# Patient Record
Sex: Male | Born: 1955 | Race: White | Hispanic: No | Marital: Single | Smoking: Current every day smoker
Health system: Southern US, Community
[De-identification: ages and names within clinical notes are randomized; demographics above are authoritative.]

## PROBLEM LIST (undated history)

## (undated) HISTORY — PX: APPENDECTOMY: SHX54

---

## 2006-08-30 ENCOUNTER — Emergency Department (HOSPITAL_COMMUNITY): Admission: EM | Admit: 2006-08-30 | Discharge: 2006-08-30 | Payer: Self-pay | Admitting: Emergency Medicine

## 2013-11-05 ENCOUNTER — Emergency Department (HOSPITAL_COMMUNITY)
Admission: EM | Admit: 2013-11-05 | Discharge: 2013-11-05 | Disposition: A | Discharge status: Home / Self Care | Payer: Self-pay | Attending: Emergency Medicine | Admitting: Emergency Medicine

## 2013-11-05 ENCOUNTER — Emergency Department (HOSPITAL_COMMUNITY): Payer: Self-pay

## 2013-11-05 ENCOUNTER — Encounter (HOSPITAL_COMMUNITY): Payer: Self-pay | Admitting: Emergency Medicine

## 2013-11-05 DIAGNOSIS — W268XXA Contact with other sharp object(s), not elsewhere classified, initial encounter: Secondary | ICD-10-CM | POA: Insufficient documentation

## 2013-11-05 DIAGNOSIS — Y990 Civilian activity done for income or pay: Secondary | ICD-10-CM | POA: Insufficient documentation

## 2013-11-05 DIAGNOSIS — Z23 Encounter for immunization: Secondary | ICD-10-CM | POA: Insufficient documentation

## 2013-11-05 DIAGNOSIS — Y9289 Other specified places as the place of occurrence of the external cause: Secondary | ICD-10-CM | POA: Insufficient documentation

## 2013-11-05 DIAGNOSIS — S51809A Unspecified open wound of unspecified forearm, initial encounter: Secondary | ICD-10-CM | POA: Insufficient documentation

## 2013-11-05 DIAGNOSIS — IMO0002 Reserved for concepts with insufficient information to code with codable children: Secondary | ICD-10-CM

## 2013-11-05 DIAGNOSIS — Y9389 Activity, other specified: Secondary | ICD-10-CM | POA: Insufficient documentation

## 2013-11-05 DIAGNOSIS — F17200 Nicotine dependence, unspecified, uncomplicated: Secondary | ICD-10-CM | POA: Insufficient documentation

## 2013-11-05 MED ORDER — LIDOCAINE HCL (PF) 1 % IJ SOLN
5.0000 mL | Freq: Once | INTRAMUSCULAR | Status: AC
  Administered 2013-11-05: 5 mL via INTRADERMAL
  Filled 2013-11-05: qty 5

## 2013-11-05 MED ORDER — TETANUS-DIPHTH-ACELL PERTUSSIS 5-2.5-18.5 LF-MCG/0.5 IM SUSP
0.5000 mL | Freq: Once | INTRAMUSCULAR | Status: AC
  Administered 2013-11-05: 0.5 mL via INTRAMUSCULAR
  Filled 2013-11-05: qty 0.5

## 2013-11-05 NOTE — ED Provider Notes (Signed)
CSN: [id number]     Arrival date & time 11/05/13  1618 History  This chart was scribed for non-physician practitioner, Junious SilkHannah Omarian Jaquith, PA-C,working with Toy CookeyMegan Docherty, MD, by Karle PlumberJennifer Tensley, ED Scribe. This patient was seen in room WTR9/WTR9 and the patient's care was started at 6:18 PM.  Chief Complaint  Patient presents with  . Extremity Laceration   The history is provided by the patient. No language interpreter was used.   HPI Comments:  Jimmy [name] is a 58 y.o. male who presents to the Emergency Department complaining of a laceration on the volar aspect of the left arm that occurred approximately 2.5 hours ago at work while opening a box. He states a large staple caught his arm and sliced into it. He reports moderate pain with touch. He states he does not recall when his last tetanus vaccination was received. He denies numbness, weakness, or tingling of the arm.  History reviewed. No pertinent past medical history. Past Surgical History  Procedure Laterality Date  . Appendectomy     History reviewed. No pertinent family history. History  Substance Use Topics  . Smoking status: Current Every Day Smoker -- 0.50 packs/day    Types: Cigarettes  . Smokeless tobacco: Not on file  . Alcohol Use: No    Review of Systems  Skin: Positive for wound.  Neurological: Negative for weakness and numbness.  All other systems reviewed and are negative.   Allergies  Review of patient's allergies indicates no known allergies.  Home Medications   Prior to Admission medications   Not on File   Triage Vitals: BP 127/75  Pulse 68  Temp(Src) 97.5 F (36.4 C) (Oral)  Resp 16  SpO2 100% Physical Exam  Nursing note and vitals reviewed. Constitutional: He is oriented to person, place, and time. He appears well-developed and well-nourished. No distress.  HENT:  Head: Normocephalic and atraumatic.  Right Ear: External ear normal.  Left Ear: External ear normal.  Nose: Nose normal.   Eyes: Conjunctivae are normal.  Neck: Normal range of motion. No tracheal deviation present.  Cardiovascular: Normal rate, regular rhythm, normal heart sounds, intact distal pulses and normal pulses.   Pulses:      Radial pulses are 2+ on the right side, and 2+ on the left side.  Pulmonary/Chest: Effort normal and breath sounds normal. No stridor.  Abdominal: Soft. He exhibits no distension. There is no tenderness.  Musculoskeletal: Normal range of motion.  Neurological: He is alert and oriented to person, place, and time.  Flexion and extension 5/5 tested against resistance. Sensation intact.   Skin: Skin is warm and dry. He is not diaphoretic.  14 cm laceration to left forearm.  Psychiatric: He has a normal mood and affect. His behavior is normal.    ED Course  Procedures (including critical care time) DIAGNOSTIC STUDIES: Oxygen Saturation is 100% on RA, normal by my interpretation.   COORDINATION OF CARE: 6:21 PM- Will X-Ray left arm before suturing. Will update tetanus vaccination. Pt verbalizes understanding and agrees to plan.  LACERATION REPAIR PROCEDURE NOTE The patient's identification was confirmed and consent was obtained. This procedure was performed by Junious SilkHannah Haddy Mullinax, PA-C at 7:41 PM. Site: volar aspect of left arm Sterile procedures observed Anesthetic used (type and amt): Lidocaine 1% without Epinephrine (4  mLs) Suture type/size: 4-0 Ethilon Length: 14 cm # of Sutures: 12 Technique:simple, interrupted Complexity: simple Antibx ointment applied Tetanus ordered Site anesthetized, irrigated with NS, explored without evidence of foreign body, wound well approximated,  site covered with dry, sterile dressing.  Patient tolerated procedure well without complications. Instructions for care discussed verbally and patient provided with additional written instructions for homecare and f/u.  Medications  Tdap (BOOSTRIX) injection 0.5 mL (0.5 mLs Intramuscular Given  11/05/13 1918)  lidocaine (PF) (XYLOCAINE) 1 % injection 5 mL (5 mLs Intradermal Given 11/05/13 1918)    Labs Review Labs Reviewed - No data to display  Imaging Review Dg Forearm Left  11/05/2013   CLINICAL DATA:  Stent the left forearm laceration and pain.  EXAM: LEFT FOREARM - 2 VIEW  COMPARISON:  None.  FINDINGS: There is no evidence of fracture or other focal bone lesions. Soft tissues are unremarkable. No evidence of radiopaque foreign body.  IMPRESSION: No evidence of fracture or radiopaque foreign body.   Electronically Signed   By: Myles Rosenthal M.D.   On: 11/05/2013 19:02     EKG Interpretation None      MDM   Final diagnoses:  Laceration    Tdap booster given. Wound cleaning complete with pressure irrigation, bottom of wound visualized, no foreign bodies appreciated. Laceration occurred < 8 hours prior to repair which was well tolerated. Pt has no co morbidities to effect normal wound healing. Discussed suture home care w pt and answered questions. Pt to f-u for wound check and suture removal in 7 days. Pt is hemodynamically stable w no complaints prior to dc.   I personally performed the services described in this documentation, which was scribed in my presence. The recorded information has been reviewed and is accurate.    Mora Bellman, PA-C 11/05/13 2040

## 2013-11-05 NOTE — ED Notes (Signed)
Per pt, was working with box.  Box staple cut left anterior forearm.  Large laceration from above wrist towards elbow.  Bleeding controlled.  Bandage applied in triage.  Pt states unknown for tetanus.  ? years

## 2013-11-05 NOTE — Discharge Instructions (Signed)

## 2013-11-05 NOTE — ED Notes (Signed)
Bacitracin ointment applied to sutures, dressing applied over

## 2013-11-06 NOTE — ED Provider Notes (Signed)
Medical screening examination/treatment/procedure(s) were performed by non-physician practitioner and as supervising physician I was immediately available for consultation/collaboration.  Toy CookeyMegan Lavergne Hiltunen, MD 11/06/13 [phone]

## 2015-02-02 IMAGING — CR DG FOREARM 2V*L*
2 series · 2 of 2 positions shown · non-contrast
Comparison: None.

CLINICAL DATA: Stent the left forearm laceration and pain.

EXAM:
LEFT FOREARM - 2 VIEW

[x forearm ap left]
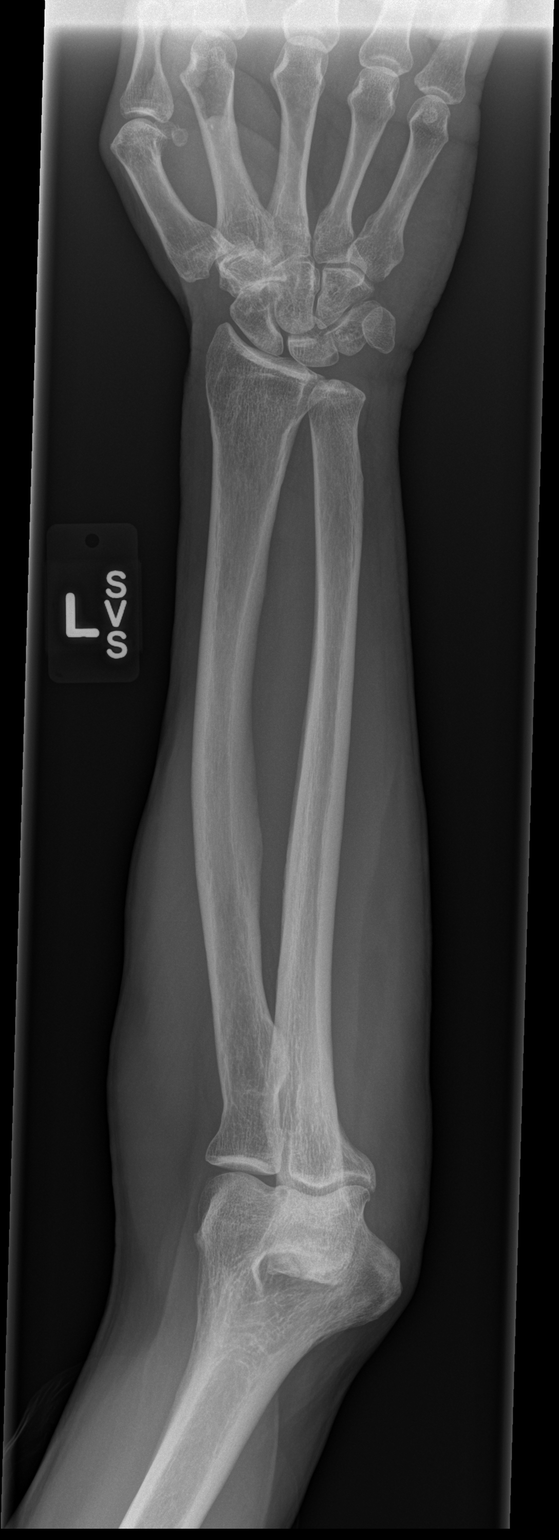

[x forearm lat left]
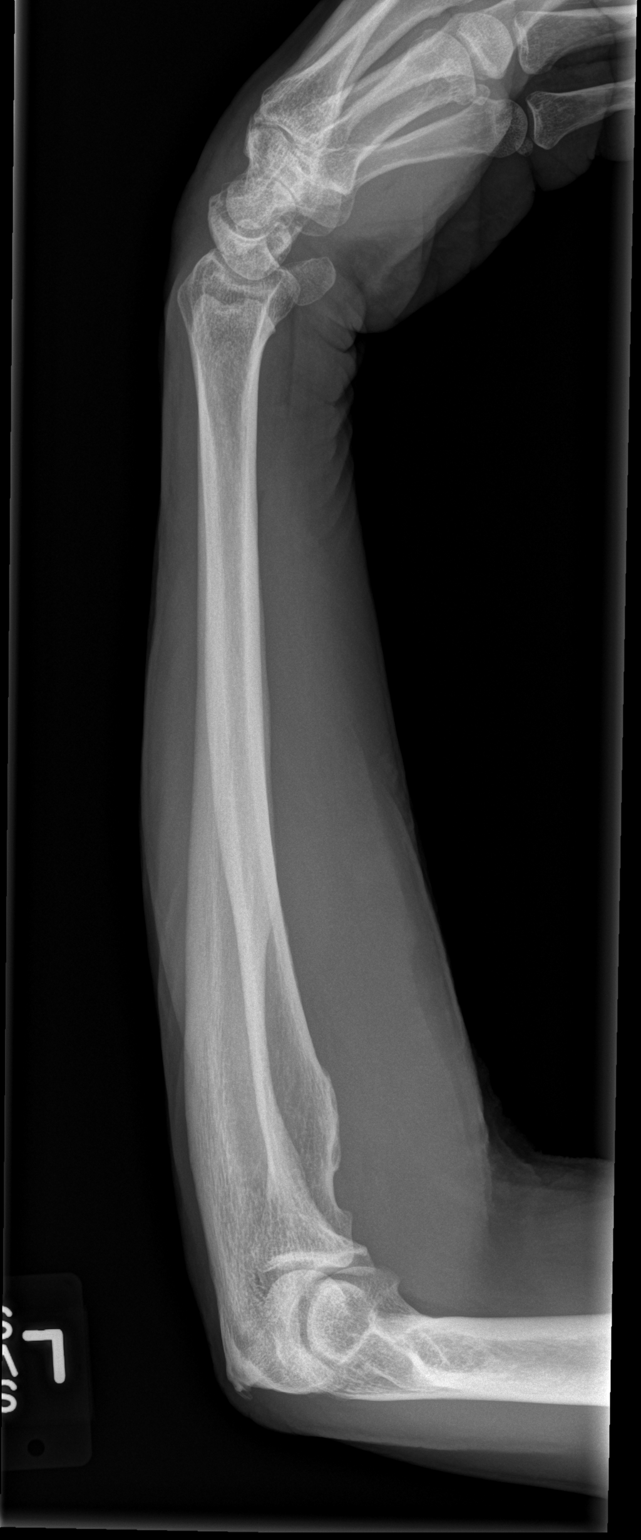

[2 of 2 positions shown; findings below may reference images not displayed]

FINDINGS: There is no evidence of fracture or other focal bone lesions. Soft
tissues are unremarkable. No evidence of radiopaque foreign body.
IMPRESSION: No evidence of fracture or radiopaque foreign body.

## 2021-02-04 ENCOUNTER — Encounter (HOSPITAL_COMMUNITY): Payer: Self-pay

## 2021-02-04 ENCOUNTER — Other Ambulatory Visit: Payer: Self-pay

## 2021-02-04 ENCOUNTER — Emergency Department (HOSPITAL_COMMUNITY): Payer: Self-pay

## 2021-02-04 ENCOUNTER — Inpatient Hospital Stay (HOSPITAL_COMMUNITY)
Admission: EM | Admit: 2021-02-04 | Discharge: 2021-02-07 | DRG: 193 | Disposition: A | Discharge status: Home / Self Care | Payer: Self-pay | Attending: Internal Medicine | Admitting: Internal Medicine

## 2021-02-04 DIAGNOSIS — Z23 Encounter for immunization: Secondary | ICD-10-CM

## 2021-02-04 DIAGNOSIS — R112 Nausea with vomiting, unspecified: Secondary | ICD-10-CM | POA: Diagnosis present

## 2021-02-04 DIAGNOSIS — E440 Moderate protein-calorie malnutrition: Secondary | ICD-10-CM | POA: Diagnosis present

## 2021-02-04 DIAGNOSIS — J449 Chronic obstructive pulmonary disease, unspecified: Secondary | ICD-10-CM | POA: Diagnosis present

## 2021-02-04 DIAGNOSIS — F17210 Nicotine dependence, cigarettes, uncomplicated: Secondary | ICD-10-CM | POA: Diagnosis present

## 2021-02-04 DIAGNOSIS — J209 Acute bronchitis, unspecified: Secondary | ICD-10-CM | POA: Diagnosis present

## 2021-02-04 DIAGNOSIS — E871 Hypo-osmolality and hyponatremia: Secondary | ICD-10-CM | POA: Diagnosis present

## 2021-02-04 DIAGNOSIS — J101 Influenza due to other identified influenza virus with other respiratory manifestations: Principal | ICD-10-CM | POA: Diagnosis present

## 2021-02-04 DIAGNOSIS — Z20822 Contact with and (suspected) exposure to covid-19: Secondary | ICD-10-CM | POA: Diagnosis present

## 2021-02-04 DIAGNOSIS — J40 Bronchitis, not specified as acute or chronic: Secondary | ICD-10-CM | POA: Diagnosis present

## 2021-02-04 DIAGNOSIS — Z681 Body mass index (BMI) 19 or less, adult: Secondary | ICD-10-CM

## 2021-02-04 DIAGNOSIS — J9601 Acute respiratory failure with hypoxia: Secondary | ICD-10-CM | POA: Diagnosis present

## 2021-02-04 LAB — COMPREHENSIVE METABOLIC PANEL
ALT: 21 U/L (ref 0–44)
AST: 32 U/L (ref 15–41)
Albumin: 3.5 g/dL (ref 3.5–5.0)
Alkaline Phosphatase: 63 U/L (ref 38–126)
Anion gap: 10 (ref 5–15)
BUN: 21 mg/dL (ref 8–23)
CO2: 24 mmol/L (ref 22–32)
Calcium: 8.7 mg/dL — ABNORMAL LOW (ref 8.9–10.3)
Chloride: 98 mmol/L (ref 98–111)
Creatinine, Ser: 1.16 mg/dL (ref 0.61–1.24)
GFR, Estimated: >60 mL/min (ref >60)
Glucose, Bld: 112 mg/dL — ABNORMAL HIGH (ref 70–99)
Potassium: 3.7 mmol/L (ref 3.5–5.1)
Sodium: 132 mmol/L — ABNORMAL LOW (ref 135–145)
Total Bilirubin: 0.8 mg/dL (ref 0.3–1.2)
Total Protein: 7.3 g/dL (ref 6.5–8.1)

## 2021-02-04 LAB — CBC
HCT: 47.1 % (ref 39.0–52.0)
Hemoglobin: 16.6 g/dL (ref 13.0–17.0)
MCH: 32.3 pg (ref 26.0–34.0)
MCHC: 35.2 g/dL (ref 30.0–36.0)
MCV: 91.6 fL (ref 80.0–100.0)
Platelets: 255 10*3/uL (ref 150–400)
RBC: 5.14 MIL/uL (ref 4.22–5.81)
RDW: 12.4 % (ref 11.5–15.5)
WBC: 6.9 10*3/uL (ref 4.0–10.5)
nRBC: 0 % (ref 0.0–0.2)

## 2021-02-04 LAB — RESP PANEL BY RT-PCR (FLU A&B, COVID) ARPGX2
Influenza A by PCR: POSITIVE — AB
Influenza B by PCR: NEGATIVE
SARS Coronavirus 2 by RT PCR: NEGATIVE

## 2021-02-04 MED ORDER — AEROCHAMBER PLUS FLO-VU LARGE MISC
Status: AC
  Filled 2021-02-04: qty 1

## 2021-02-04 MED ORDER — ALBUTEROL SULFATE HFA 108 (90 BASE) MCG/ACT IN AERS
2.0000 | INHALATION_SPRAY | RESPIRATORY_TRACT | Status: DC | PRN
  Administered 2021-02-04: 2 via RESPIRATORY_TRACT
  Filled 2021-02-04: qty 6.7

## 2021-02-04 NOTE — ED Provider Notes (Signed)
Emergency Medicine Provider Triage Evaluation Note  Jimmy [name] , a 65 y.o. male  was evaluated in triage.  Pt complains of cough, shortness of breath, chills, general malaise for the last week. Heavy smoker 1.5-2.5 packs per week for many years. Lots of sputum production, no blood. No hx COPD, not on any medications for breathing or O2 at baseline.  Review of Systems  Positive: SOB, cough, chills Negative: Chest pain  Physical Exam  BP 122/80 (BP Location: Right Arm)   Pulse 98   Temp 98.6 F (37 C) (Axillary)   Resp 20   Ht 5\' 2"  (1.575 m)   Wt 53.5 kg   SpO2 (!) 87%   BMI 21.58 kg/m  Gen:   Awake, ill appearing, poor respiratory effort, on 4L nasal cannula, looks cachectic Resp:  Poor effort, decreased breath sounds throughout MSK:   Moves extremities without difficulty  Other:  Distant heart sounds  Medical Decision Making  Medically screening exam initiated at 9:02 PM.  Appropriate orders placed.  Jimmy [name] was informed that the remainder of the evaluation will be completed by another provider, this initial triage assessment does not replace that evaluation, and the importance of remaining in the ED until their evaluation is complete.  SOB, cough, smoker for years   Lelon Frohlich 02/04/21 2105    2106, MD 02/05/21 1103

## 2021-02-04 NOTE — ED Triage Notes (Signed)
Pt reports productive cough, congestion, sob for the past week. smoker

## 2021-02-04 NOTE — ED Notes (Signed)
Pt placed on 3L [place] to get O2 saturation to 92%

## 2021-02-05 ENCOUNTER — Observation Stay (HOSPITAL_COMMUNITY): Payer: Self-pay

## 2021-02-05 ENCOUNTER — Encounter (HOSPITAL_COMMUNITY): Payer: Self-pay | Admitting: Internal Medicine

## 2021-02-05 ENCOUNTER — Observation Stay (HOSPITAL_BASED_OUTPATIENT_CLINIC_OR_DEPARTMENT_OTHER): Payer: Self-pay

## 2021-02-05 DIAGNOSIS — J209 Acute bronchitis, unspecified: Secondary | ICD-10-CM | POA: Diagnosis present

## 2021-02-05 DIAGNOSIS — J101 Influenza due to other identified influenza virus with other respiratory manifestations: Principal | ICD-10-CM

## 2021-02-05 DIAGNOSIS — J40 Bronchitis, not specified as acute or chronic: Secondary | ICD-10-CM | POA: Diagnosis present

## 2021-02-05 DIAGNOSIS — R7989 Other specified abnormal findings of blood chemistry: Secondary | ICD-10-CM

## 2021-02-05 LAB — TROPONIN I (HIGH SENSITIVITY)
Troponin I (High Sensitivity): 7 ng/L (ref <18)
Troponin I (High Sensitivity): 6 ng/L (ref <18)
Troponin I (High Sensitivity): 5 ng/L (ref <18)

## 2021-02-05 LAB — D-DIMER, QUANTITATIVE: D-Dimer, Quant: 17.72 ug/mL-FEU — ABNORMAL HIGH (ref 0.00–0.50)

## 2021-02-05 LAB — CBC
HCT: 45.1 % (ref 39.0–52.0)
Hemoglobin: 15.7 g/dL (ref 13.0–17.0)
MCH: 31.7 pg (ref 26.0–34.0)
MCHC: 34.8 g/dL (ref 30.0–36.0)
MCV: 90.9 fL (ref 80.0–100.0)
Platelets: 243 10*3/uL (ref 150–400)
RBC: 4.96 MIL/uL (ref 4.22–5.81)
RDW: 12.5 % (ref 11.5–15.5)
WBC: 6.8 10*3/uL (ref 4.0–10.5)
nRBC: 0 % (ref 0.0–0.2)

## 2021-02-05 LAB — CREATININE, SERUM
Creatinine, Ser: 1.07 mg/dL (ref 0.61–1.24)
GFR, Estimated: >60 mL/min (ref >60)

## 2021-02-05 LAB — HIV ANTIBODY (ROUTINE TESTING W REFLEX): HIV Screen 4th Generation wRfx: NONREACTIVE

## 2021-02-05 MED ORDER — IPRATROPIUM-ALBUTEROL 0.5-2.5 (3) MG/3ML IN SOLN
3.0000 mL | RESPIRATORY_TRACT | Status: AC
  Administered 2021-02-05 (×3): 3 mL via RESPIRATORY_TRACT
  Filled 2021-02-05: qty 3

## 2021-02-05 MED ORDER — ONDANSETRON HCL 4 MG/2ML IJ SOLN
4.0000 mg | Freq: Once | INTRAMUSCULAR | Status: DC
  Filled 2021-02-05: qty 2

## 2021-02-05 MED ORDER — INFLUENZA VAC A&B SA ADJ QUAD 0.5 ML IM PRSY
0.5000 mL | PREFILLED_SYRINGE | INTRAMUSCULAR | Status: AC
  Administered 2021-02-06: 0.5 mL via INTRAMUSCULAR
  Filled 2021-02-05: qty 0.5

## 2021-02-05 MED ORDER — IOHEXOL 350 MG/ML SOLN
100.0000 mL | Freq: Once | INTRAVENOUS | Status: AC | PRN
  Administered 2021-02-05: 100 mL via INTRAVENOUS

## 2021-02-05 MED ORDER — IPRATROPIUM-ALBUTEROL 0.5-2.5 (3) MG/3ML IN SOLN
3.0000 mL | RESPIRATORY_TRACT | Status: DC
  Administered 2021-02-05: 3 mL via RESPIRATORY_TRACT
  Filled 2021-02-05: qty 3

## 2021-02-05 MED ORDER — METHYLPREDNISOLONE SODIUM SUCC 125 MG IJ SOLR
80.0000 mg | Freq: Every day | INTRAMUSCULAR | Status: DC
  Administered 2021-02-05 – 2021-02-07 (×3): 80 mg via INTRAVENOUS
  Filled 2021-02-05 (×4): qty 2

## 2021-02-05 MED ORDER — ALBUTEROL SULFATE (2.5 MG/3ML) 0.083% IN NEBU: 2.5000 mg | INHALATION_SOLUTION | RESPIRATORY_TRACT | Status: DC | PRN

## 2021-02-05 MED ORDER — OSELTAMIVIR PHOSPHATE 30 MG PO CAPS
30.0000 mg | ORAL_CAPSULE | Freq: Two times a day (BID) | ORAL | Status: DC
  Administered 2021-02-05 – 2021-02-07 (×5): 30 mg via ORAL
  Filled 2021-02-05 (×6): qty 1

## 2021-02-05 MED ORDER — ACETAMINOPHEN 650 MG RE SUPP: 650.0000 mg | Freq: Four times a day (QID) | RECTAL | Status: DC | PRN

## 2021-02-05 MED ORDER — SODIUM CHLORIDE 0.9 % IV SOLN: INTRAVENOUS | Status: AC

## 2021-02-05 MED ORDER — ENOXAPARIN SODIUM 40 MG/0.4ML IJ SOSY
40.0000 mg | PREFILLED_SYRINGE | Freq: Every day | INTRAMUSCULAR | Status: DC
  Administered 2021-02-05 – 2021-02-07 (×3): 40 mg via SUBCUTANEOUS
  Filled 2021-02-05 (×3): qty 0.4

## 2021-02-05 MED ORDER — BUDESONIDE 0.25 MG/2ML IN SUSP
0.2500 mg | Freq: Two times a day (BID) | RESPIRATORY_TRACT | Status: DC
  Administered 2021-02-05 – 2021-02-07 (×5): 0.25 mg via RESPIRATORY_TRACT
  Filled 2021-02-05 (×6): qty 2

## 2021-02-05 MED ORDER — IPRATROPIUM-ALBUTEROL 0.5-2.5 (3) MG/3ML IN SOLN
3.0000 mL | Freq: Four times a day (QID) | RESPIRATORY_TRACT | Status: DC
  Administered 2021-02-05 – 2021-02-07 (×7): 3 mL via RESPIRATORY_TRACT
  Filled 2021-02-05 (×7): qty 3

## 2021-02-05 MED ORDER — SODIUM CHLORIDE 0.9 % IV SOLN
500.0000 mg | Freq: Every day | INTRAVENOUS | Status: DC
  Administered 2021-02-05 (×2): 500 mg via INTRAVENOUS
  Filled 2021-02-05 (×2): qty 500

## 2021-02-05 MED ORDER — ACETAMINOPHEN 325 MG PO TABS: 650.0000 mg | ORAL_TABLET | Freq: Four times a day (QID) | ORAL | Status: DC | PRN

## 2021-02-05 MED ORDER — SODIUM CHLORIDE 0.9 % IV BOLUS
1000.0000 mL | Freq: Once | INTRAVENOUS | Status: AC
Start: 2021-02-05 — End: 2021-02-05
  Administered 2021-02-05: 1000 mL via INTRAVENOUS

## 2021-02-05 MED ORDER — IPRATROPIUM BROMIDE 0.02 % IN SOLN: 0.5000 mg | RESPIRATORY_TRACT | Status: DC

## 2021-02-05 MED ORDER — OSELTAMIVIR PHOSPHATE 75 MG PO CAPS
75.0000 mg | ORAL_CAPSULE | Freq: Once | ORAL | Status: AC
  Administered 2021-02-05: 75 mg via ORAL
  Filled 2021-02-05: qty 1

## 2021-02-05 MED ORDER — ALBUTEROL SULFATE (2.5 MG/3ML) 0.083% IN NEBU: 2.5000 mg | INHALATION_SOLUTION | RESPIRATORY_TRACT | Status: DC

## 2021-02-05 NOTE — H&P (Signed)
History and Physical    Jimmy [name] KGS:[id number] DOB: [birth year] DOA: 02/04/2021  PCP: Pcp, No  Patient coming from: Home.  Chief Complaint: Shortness of breath.  HPI: Jimmy [name] is a 65 y.o. male with history of tobacco abuse who has not been to a physician for many years does not take any medications presents to the ER because of ongoing shortness of breath for the last 1 week with productive cough and chest pain on coughing.  Patient has been producing whitish colored sputum.  Denies any recent travel or sick contacts.  ED Course: In the ER patient is found to be hypoxic requiring 3 L oxygen and was breathing on exam.  Chest x-ray shows features concerning for COPD.  EKG shows normal sinus rhythm.  Labs show mild hyponatremia.  Influenza A came back positive and patient was started on Tamiflu in addition to patient also was on Solu-Medrol nebulizer and admitted for further observation.  Review of Systems: As per HPI, rest all negative.   History reviewed. No pertinent past medical history.  Past Surgical History:  Procedure Laterality Date   APPENDECTOMY       reports that he has been smoking cigarettes. He has been smoking an average of .5 packs per day. He has never used smokeless tobacco. He reports that he does not drink alcohol and does not use drugs.  No Known Allergies  Family History  Problem Relation Age of Onset   CAD Neg Hx     Prior to Admission medications   Not on File    Physical Exam: Constitutional: Moderately built and nourished. Vitals:   02/04/21 2053 02/04/21 2055 02/04/21 2253 02/05/21 0101  BP: 122/80  133/88 106/88  Pulse: 98  82 85  Resp: 20  19 20   Temp:  98.6 F (37 C)    TempSrc:  Axillary    SpO2: (!) 87%  98% 100%  Weight:      Height:       Eyes: Anicteric no pallor. ENMT: No discharge from the ears eyes nose and mouth. Neck: No mass felt.  No neck rigidity. Respiratory: Mild expiratory wheeze and no  crepitations. Cardiovascular: S1-S2 heard. Abdomen: Soft nontender bowel sound present. Musculoskeletal: No edema. Skin: No rash. Neurologic: Alert awake oriented to time place and person.  Moves all extremities. Psychiatric: Appears normal.  Normal affect.   Labs on Admission: I have personally reviewed following labs and imaging studies  CBC: Recent Labs  Lab 02/04/21 2127  WBC 6.9  HGB 16.6  HCT 47.1  MCV 91.6  PLT 255   Basic Metabolic Panel: Recent Labs  Lab 02/04/21 2127  NA 132*  K 3.7  CL 98  CO2 24  GLUCOSE 112*  BUN 21  CREATININE 1.16  CALCIUM 8.7*   GFR: Estimated Creatinine Clearance: 48 mL/min (by C-G formula based on SCr of 1.16 mg/dL). Liver Function Tests: Recent Labs  Lab 02/04/21 2127  AST 32  ALT 21  ALKPHOS 63  BILITOT 0.8  PROT 7.3  ALBUMIN 3.5   No results for input(s): LIPASE, AMYLASE in the last 168 hours. No results for input(s): AMMONIA in the last 168 hours. Coagulation Profile: No results for input(s): INR, PROTIME in the last 168 hours. Cardiac Enzymes: No results for input(s): CKTOTAL, CKMB, CKMBINDEX, TROPONINI in the last 168 hours. BNP (last 3 results) No results for input(s): PROBNP in the last 8760 hours. HbA1C: No results for input(s): HGBA1C in the last 72 hours. CBG: No  results for input(s): GLUCAP in the last 168 hours. Lipid Profile: No results for input(s): CHOL, HDL, LDLCALC, TRIG, CHOLHDL, LDLDIRECT in the last 72 hours. Thyroid Function Tests: No results for input(s): TSH, T4TOTAL, FREET4, T3FREE, THYROIDAB in the last 72 hours. Anemia Panel: No results for input(s): VITAMINB12, FOLATE, FERRITIN, TIBC, IRON, RETICCTPCT in the last 72 hours. Urine analysis: No results found for: COLORURINE, APPEARANCEUR, LABSPEC, PHURINE, GLUCOSEU, HGBUR, BILIRUBINUR, KETONESUR, PROTEINUR, UROBILINOGEN, NITRITE, LEUKOCYTESUR Sepsis Labs: @LABRCNTIP (procalcitonin:4,lacticidven:4) ) Recent Results (from the past 240  hour(s))  Resp Panel by RT-PCR (Flu A&B, Covid) Nasopharyngeal Swab     Status: Abnormal   Collection Time: 02/04/21  8:52 PM   Specimen: Nasopharyngeal Swab; Nasopharyngeal(NP) swabs in vial transport medium  Result Value Ref Range Status   SARS Coronavirus 2 by RT PCR NEGATIVE NEGATIVE Final    Comment: (NOTE) SARS-CoV-2 target nucleic acids are NOT DETECTED.  The SARS-CoV-2 RNA is generally detectable in upper respiratory specimens during the acute phase of infection. The lowest concentration of SARS-CoV-2 viral copies this assay can detect is 138 copies/mL. A negative result does not preclude SARS-Cov-2 infection and should not be used as the sole basis for treatment or other patient management decisions. A negative result may occur with  improper specimen collection/handling, submission of specimen other than nasopharyngeal swab, presence of viral mutation(s) within the areas targeted by this assay, and inadequate number of viral copies(<138 copies/mL). A negative result must be combined with clinical observations, patient history, and epidemiological information. The expected result is Negative.  Fact Sheet for Patients:  13/05/22  Fact Sheet for Healthcare Providers:  BloggerCourse.com  This test is no t yet approved or cleared by the [employer] FDA and  has been authorized for detection and/or diagnosis of SARS-CoV-2 by FDA under an Emergency Use Authorization (EUA). This EUA will remain  in effect (meaning this test can be used) for the duration of the COVID-19 declaration under Section 564(b)(1) of the Act, 21 U.S.C.section 360bbb-3(b)(1), unless the authorization is terminated  or revoked sooner.       Influenza A by PCR POSITIVE (A) NEGATIVE Final   Influenza B by PCR NEGATIVE NEGATIVE Final    Comment: (NOTE) The Xpert Xpress SARS-CoV-2/FLU/RSV plus assay is intended as an aid in the diagnosis of  influenza from Nasopharyngeal swab specimens and should not be used as a sole basis for treatment. Nasal washings and aspirates are unacceptable for Xpert Xpress SARS-CoV-2/FLU/RSV testing.  Fact Sheet for Patients: [place]  Fact Sheet for Healthcare Providers: BloggerCourse.com  This test is not yet approved or cleared by the [employer] FDA and has been authorized for detection and/or diagnosis of SARS-CoV-2 by FDA under an Emergency Use Authorization (EUA). This EUA will remain in effect (meaning this test can be used) for the duration of the COVID-19 declaration under Section 564(b)(1) of the Act, 21 U.S.C. section 360bbb-3(b)(1), unless the authorization is terminated or revoked.  Performed at [facility], [address] [place], [place]      Radiological Exams on Admission: DG Chest 2 View  Result Date: 02/04/2021 CLINICAL DATA:  Shortness of breath and cough EXAM: CHEST - 2 VIEW COMPARISON:  08/30/2006 FINDINGS: Cardiac shadow is within normal limits. Lungs are hyperinflated without focal infiltrate. Old healed rib fractures are noted. No acute bony abnormality is seen. IMPRESSION: COPD without acute abnormality. Electronically Signed   By: 09/01/2006 M.D.   On: 02/04/2021 21:52    EKG: Independently reviewed.  Normal sinus rhythm.  Assessment/Plan Principal Problem:   Acute bronchitis Active Problems:   Influenza A    Acute bronchitis likely place.  By influenza-like infection for which patient is presently on Solu-Medrol Pulmicort and nebulizer.  Since patient has copious production of sputum I added Zithromax. Influenza A infection started on Tamiflu for 5 days course. Tobacco abuse advised about quitting. Pleuritic type of chest pain on coughing.  Likely could be from bronchitis.  Will check D-dimer and troponin. Mild hyponatremia cause not clear recheck metabolic panel.   DVT  prophylaxis: Lovenox. Code Status: Full code. Family Communication: Discussed with patient. Disposition Plan: Home. Consults called: None. Admission status: Observation.   Eduard Clos MD [facility] Pager [phone].  If 7PM-7AM, please contact night-coverage [email or web address] Password [email or web address]  02/05/2021, 1:31 AM

## 2021-02-05 NOTE — ED Notes (Signed)
Patient transported to CT 

## 2021-02-05 NOTE — Progress Notes (Signed)
BLE venous duplex has been completed.  Results can be found under chart review under CV PROC. 02/05/2021 12:04 PM Stefan Karen RVT, RDMS

## 2021-02-05 NOTE — Progress Notes (Signed)
  Subjective:  Patient admitted this morning, see detailed H&P by Dr Toniann Fail 65 year old male with history of tobacco abuse who has not been to a physician for many years came to ED with ongoing shortness of breath for past 1 week, productive cough and chest pain on coughing.  In the ED patient was found to have positive influenza A and started on Tamiflu.  D-dimer was significantly elevated at 17.72  Vitals:   02/05/21 0830 02/05/21 1034  BP: 121/70 114/75  Pulse: 64 76  Resp: (!) 23 (!) 22  Temp:  (!) 97.5 F (36.4 C)  SpO2: 97% 95%      A/P  Acute bronchitis -secondary to influenza.  Started on Solu-Medrol, DuoNebs every 6 hours, also started on Tamiflu  Elevated D-dimer -D-dimer is elevated to 17.72 -CTA chest obtained was negative for PE -Shows significant coronary calcifications; will cycle troponin, if elevated consider cardiology consultation -Initial troponin only 7 -We will obtain venous duplex of lower extremities  Influenza A -Started on Tamiflu as above  Mild hyponatremia -Unclear etiology -Likely from dehydration -Start gentle IV hydration and follow BMP in am     Meredeth Ide [facility] Pager- [phone]

## 2021-02-05 NOTE — ED Provider Notes (Signed)
[facility] EMERGENCY DEPARTMENT Provider Note   CSN: [id number] Arrival date & time: 02/04/21  2047     History Chief Complaint  Patient presents with   Cough   Nasal Congestion   Shortness of Breath    Jimmy [name] is a 65 y.o. male.  65 yo M with a chief complaints of cough congestion fever shortness of breath nausea and vomiting.  This been going on for about a week now.  Feeling his breathing is gotten a lot worse over the past 48 hours and so ended up calling EMS.  Was found to be hypoxic in the mid 63s.  Started on oxygen.  Not on oxygen at home.  Every day smoker.  No history of COPD.  Received breathing medicine without significant improvement.  The history is provided by the patient.  Cough Associated symptoms: chills, fever and shortness of breath   Associated symptoms: no chest pain, no eye discharge, no headaches, no myalgias and no rash   Shortness of Breath Associated symptoms: cough, fever and vomiting   Associated symptoms: no abdominal pain, no chest pain, no headaches and no rash   Illness Severity:  Moderate Onset quality:  Gradual Duration:  2 days Timing:  Constant Progression:  Worsening Chronicity:  New Associated symptoms: cough, fever, nausea, shortness of breath and vomiting   Associated symptoms: no abdominal pain, no chest pain, no congestion, no diarrhea, no headaches, no myalgias and no rash       History reviewed. No pertinent past medical history.  There are no problems to display for this patient.   Past Surgical History:  Procedure Laterality Date   APPENDECTOMY         No family history on file.  Social History   Tobacco Use   Smoking status: Every Day    Packs/day: 0.50    Types: Cigarettes  Substance Use Topics   Alcohol use: No    Home Medications Prior to Admission medications   Not on File    Allergies    Patient has no known allergies.  Review of Systems   Review of Systems   Constitutional:  Positive for chills and fever.  HENT:  Negative for congestion and facial swelling.   Eyes:  Negative for discharge and visual disturbance.  Respiratory:  Positive for cough and shortness of breath.   Cardiovascular:  Negative for chest pain and palpitations.  Gastrointestinal:  Positive for nausea and vomiting. Negative for abdominal pain and diarrhea.  Musculoskeletal:  Negative for arthralgias and myalgias.  Skin:  Negative for color change and rash.  Neurological:  Negative for tremors, syncope and headaches.  Psychiatric/Behavioral:  Negative for confusion and dysphoric mood.    Physical Exam Updated Vital Signs BP 106/88 (BP Location: Left Arm)   Pulse 85   Temp 98.6 F (37 C) (Axillary)   Resp 20   Ht 5\' 2"  (1.575 m)   Wt 53.5 kg   SpO2 100%   BMI 21.58 kg/m   Physical Exam Vitals and nursing note reviewed.  Constitutional:      Appearance: He is well-developed.  HENT:     Head: Normocephalic and atraumatic.  Eyes:     Pupils: Pupils are equal, round, and reactive to light.  Neck:     Vascular: No JVD.  Cardiovascular:     Rate and Rhythm: Normal rate and regular rhythm.     Heart sounds: No murmur heard.   No friction rub. No gallop.  Pulmonary:  Effort: No respiratory distress.     Breath sounds: No wheezing.  Abdominal:     General: There is no distension.     Tenderness: There is no abdominal tenderness. There is no guarding or rebound.  Musculoskeletal:        General: Normal range of motion.     Cervical back: Normal range of motion and neck supple.  Skin:    Coloration: Skin is not pale.     Findings: No rash.  Neurological:     Mental Status: He is alert and oriented to person, place, and time.  Psychiatric:        Behavior: Behavior normal.    ED Results / Procedures / Treatments   Labs (all labs ordered are listed, but only abnormal results are displayed) Labs Reviewed  RESP PANEL BY RT-PCR (FLU A&B, COVID) [id number] -  Abnormal; Notable for the following components:      Result Value   Influenza A by PCR POSITIVE (*)    All other components within normal limits  COMPREHENSIVE METABOLIC PANEL - Abnormal; Notable for the following components:   Sodium 132 (*)    Glucose, Bld 112 (*)    Calcium 8.7 (*)    All other components within normal limits  CBC    EKG EKG Interpretation  Date/Time:  Saturday February 04 2021 20:49:07 EDT Ventricular Rate:  98 PR Interval:  150 QRS Duration: 74 QT Interval:  312 QTC Calculation: 398 R Axis:   84 Text Interpretation: Normal sinus rhythm Anteroseptal infarct , age undetermined Abnormal ECG No old tracing to compare Confirmed by Melene Plan [phone]) on 02/05/2021 12:54:52 AM  Radiology DG Chest 2 View  Result Date: 02/04/2021 CLINICAL DATA:  Shortness of breath and cough EXAM: CHEST - 2 VIEW COMPARISON:  08/30/2006 FINDINGS: Cardiac shadow is within normal limits. Lungs are hyperinflated without focal infiltrate. Old healed rib fractures are noted. No acute bony abnormality is seen. IMPRESSION: COPD without acute abnormality. Electronically Signed   By: Alcide Clever M.D.   On: 02/04/2021 21:52    Procedures Procedures   Medications Ordered in ED Medications  albuterol (VENTOLIN HFA) 108 (90 Base) MCG/ACT inhaler 2 puff (2 puffs Inhalation Given 02/04/21 2109)  AeroChamber Plus Flo-Vu Large MISC (has no administration in time range)  sodium chloride 0.9 % bolus 1,000 mL (has no administration in time range)  ondansetron (ZOFRAN) injection 4 mg (has no administration in time range)  ipratropium-albuterol (DUONEB) 0.5-2.5 (3) MG/3ML nebulizer solution 3 mL (has no administration in time range)    ED Course  I have reviewed the triage vital signs and the nursing notes.  Pertinent labs & imaging results that were available during my care of the patient were reviewed by me and considered in my medical decision making (see chart for details).    MDM  Rules/Calculators/A&P                           65 yo M with a chief complaints of shortness of breath cough fever nausea vomiting going on for the past week.  Patient found to have influenza A here.  No significant electrolyte abnormality no significant anemia chest x-ray viewed by me without focal infiltrate.  The patient is hypoxic here in the ED oxygen saturation in the mid 80s while at rest with tachypnea.  Will discuss with hospitalist for admission.  The patients results and plan were reviewed and discussed.   Any  x-rays performed were independently reviewed by myself.   Differential diagnosis were considered with the presenting HPI.  Medications  albuterol (VENTOLIN HFA) 108 (90 Base) MCG/ACT inhaler 2 puff (2 puffs Inhalation Given 02/04/21 2109)  AeroChamber Plus Flo-Vu Large MISC (has no administration in time range)  sodium chloride 0.9 % bolus 1,000 mL (has no administration in time range)  ondansetron (ZOFRAN) injection 4 mg (has no administration in time range)  ipratropium-albuterol (DUONEB) 0.5-2.5 (3) MG/3ML nebulizer solution 3 mL (has no administration in time range)    Vitals:   02/04/21 2053 02/04/21 2055 02/04/21 2253 02/05/21 0101  BP: 122/80  133/88 106/88  Pulse: 98  82 85  Resp: 20  19 20   Temp:  98.6 F (37 C)    TempSrc:  Axillary    SpO2: (!) 87%  98% 100%  Weight:      Height:        Final diagnoses:  Influenza A  Acute respiratory failure with hypoxia (HCC)    Admission/ observation were discussed with the admitting physician, patient and/or family and they are comfortable with the plan.   Final Clinical Impression(s) / ED Diagnoses Final diagnoses:  Influenza A  Acute respiratory failure with hypoxia [facility])    Rx / DC Orders ED Discharge Orders     None        Deno Etienne, DO 02/05/21 0119

## 2021-02-05 NOTE — ED Notes (Signed)
Patient taken to vascular lab.

## 2021-02-06 LAB — BASIC METABOLIC PANEL
Anion gap: 7 (ref 5–15)
BUN: 22 mg/dL (ref 8–23)
CO2: 23 mmol/L (ref 22–32)
Calcium: 8.2 mg/dL — ABNORMAL LOW (ref 8.9–10.3)
Chloride: 106 mmol/L (ref 98–111)
Creatinine, Ser: 0.99 mg/dL (ref 0.61–1.24)
GFR, Estimated: >60 mL/min (ref >60)
Glucose, Bld: 147 mg/dL — ABNORMAL HIGH (ref 70–99)
Potassium: 3.6 mmol/L (ref 3.5–5.1)
Sodium: 136 mmol/L (ref 135–145)

## 2021-02-06 MED ORDER — AZITHROMYCIN 500 MG PO TABS
500.0000 mg | ORAL_TABLET | Freq: Every day | ORAL | Status: DC
  Administered 2021-02-06: 500 mg via ORAL
  Filled 2021-02-06: qty 1

## 2021-02-06 NOTE — Progress Notes (Signed)
PROGRESS NOTE    Jimmy [name]  GHW:[id number] DOB: 10-19-1955 DOA: 02/04/2021 PCP: Pcp, No   Chief Complaint  Patient presents with   Cough   Nasal Congestion   Shortness of Breath   Brief Narrative/Hospital Course: Jimmy [name], 65 y.o. male with PMH of tobacco abuse who has not been to a physician for many years does not take any medications presents to the ER because of ongoing shortness of breath for the last 1 week with productive cough and chest pain on coughing.  Patient has been producing whitish colored sputum.  Denies any recent travel or sick contacts.  In ED-patient was found to be hypoxic requiring 3 L oxygen and was breathing on exam.  Chest x-ray shows features concerning for COPD.  EKG shows normal sinus rhythm.  Labs show mild hyponatremia.  Influenza A came back positive   Subjective: No chest pain or cough Still on 2l [place] spo2 at 92% Feels much better Lives home alone and smokes 2 pk/1 wk from abt 2 pk/1 wk  Assessment & Plan:  Acute bronchitis Influenza A infection Chronic smoking history possible COPD: Able to cough oxygen but becomes short of breath dyspneic and oxygen dropped to 89% with walking on RA with labored breathing.  We will keep him additional night for IV steroids bronchodilators azithromycin.   Hopefully discharge tomorrow if remains stable.  Malnutrition moderate in the context of chronic illeness- Low BMI at Body mass index is 19.64 kg/m.  Dietitian consult DVT prophylaxis: enoxaparin (LOVENOX) injection 40 mg Start: 02/05/21 1000 Code Status:   Code Status: Full Code Family Communication: plan of care discussed with patient at bedside. Status is: Inpatient  Remains inpatient appropriate because: For ongoing management of respiratory failure with influenza  Objective: Vitals last 24 hrs: Vitals:   02/06/21 0400 02/06/21 0443 02/06/21 0803 02/06/21 0826  BP: (!) 98/58 102/65 97/72   Pulse: 62 64 66   Resp: 20 20 18    Temp: 97.8 F  (36.6 C) 97.8 F (36.6 C) 97.7 F (36.5 C)   TempSrc: Oral Oral Axillary   SpO2: 93% 95% 96% 97%  Weight:      Height:       Weight change: -4.825 kg  Intake/Output Summary (Last 24 hours) at 02/06/2021 1003 Last data filed at 02/06/2021 0439 Gross per 24 hour  Intake --  Output 300 ml  Net -300 ml   Net IO Since Admission: 950 mL [02/06/21 1003]   Physical Examination: General exam: Aa0x3, thin frail, weak,older than stated age. HEENT:Oral mucosa moist, Ear/Nose WNL grossly,dentition normal. Respiratory system: B/l diminished BS, no use of accessory muscle, non tender. Cardiovascular system: S1 & S2 +,No JVD. Gastrointestinal system: Abdomen soft, NT,ND, BS+. Nervous System:Alert, awake, moving extremities. Extremities: edema none, distal peripheral pulses palpable.  Skin: No rashes, no icterus. MSK: Normal muscle bulk, tone, power.  Medications reviewed:  Scheduled Meds:  budesonide (PULMICORT) nebulizer solution  0.25 mg Nebulization BID   enoxaparin (LOVENOX) injection  40 mg Subcutaneous Daily   influenza vaccine adjuvanted  0.5 mL Intramuscular Tomorrow-1000   ipratropium-albuterol  3 mL Nebulization Q6H   methylPREDNISolone (SOLU-MEDROL) injection  80 mg Intravenous Daily   oseltamivir  30 mg Oral BID   Continuous Infusions:  azithromycin Stopped (02/05/21 2209)    Diet Order             Diet regular Room service appropriate? Yes; Fluid consistency: Thin  Diet effective now  Weight change: -4.825 kg  Wt Readings from Last 3 Encounters:  02/05/21 48.7 kg     Consultants:see note  Procedures:see note Antimicrobials: Anti-infectives (From admission, onward)    Start     Dose/Rate Route Frequency Ordered Stop   02/05/21 1000  oseltamivir (TAMIFLU) capsule 30 mg       Note to Pharmacy: Tamiflu 30 mg BID for CrCl < 60 mL/min   30 mg Oral 2 times daily 02/05/21 0506 02/09/21 2159   02/05/21 0145  azithromycin (ZITHROMAX)  500 mg in sodium chloride 0.9 % 250 mL IVPB        500 mg 250 mL/hr over 60 Minutes Intravenous Daily at bedtime 02/05/21 0132 02/09/21 2159   02/05/21 0130  oseltamivir (TAMIFLU) capsule 75 mg        75 mg Oral  Once 02/05/21 0122 02/05/21 0226      Culture/Microbiology No results found for: SDES, SPECREQUEST, CULT, REPTSTATUS  Other culture-see note  Unresulted Labs (From admission, onward)     Start     Ordered   02/12/21 0500  Creatinine, serum  (enoxaparin (LOVENOX)    CrCl >/= 30 ml/min)  Weekly,   R     Comments: while on enoxaparin therapy    02/05/21 0130          Data Reviewed: I have personally reviewed following labs and imaging studies CBC: Recent Labs  Lab 02/04/21 2127 02/05/21 0129  WBC 6.9 6.8  HGB 16.6 15.7  HCT 47.1 45.1  MCV 91.6 90.9  PLT 255 243   Basic Metabolic Panel: Recent Labs  Lab 02/04/21 2127 02/05/21 0129 02/06/21 0142  NA 132*  --  136  K 3.7  --  3.6  CL 98  --  106  CO2 24  --  23  GLUCOSE 112*  --  147*  BUN 21  --  22  CREATININE 1.16 1.07 0.99  CALCIUM 8.7*  --  8.2*   GFR: Estimated Creatinine Clearance: 51.2 mL/min (by C-G formula based on SCr of 0.99 mg/dL). Liver Function Tests: Recent Labs  Lab 02/04/21 2127  AST 32  ALT 21  ALKPHOS 63  BILITOT 0.8  PROT 7.3  ALBUMIN 3.5   No results for input(s): LIPASE, AMYLASE in the last 168 hours. No results for input(s): AMMONIA in the last 168 hours. Coagulation Profile: No results for input(s): INR, PROTIME in the last 168 hours. Cardiac Enzymes: No results for input(s): CKTOTAL, CKMB, CKMBINDEX, TROPONINI in the last 168 hours. BNP (last 3 results) No results for input(s): PROBNP in the last 8760 hours. HbA1C: No results for input(s): HGBA1C in the last 72 hours. CBG: No results for input(s): GLUCAP in the last 168 hours. Lipid Profile: No results for input(s): CHOL, HDL, LDLCALC, TRIG, CHOLHDL, LDLDIRECT in the last 72 hours. Thyroid Function Tests: No  results for input(s): TSH, T4TOTAL, FREET4, T3FREE, THYROIDAB in the last 72 hours. Anemia Panel: No results for input(s): VITAMINB12, FOLATE, FERRITIN, TIBC, IRON, RETICCTPCT in the last 72 hours. Sepsis Labs: No results for input(s): PROCALCITON, LATICACIDVEN in the last 168 hours.  Recent Results (from the past 240 hour(s))  Resp Panel by RT-PCR (Flu A&B, Covid) Nasopharyngeal Swab     Status: Abnormal   Collection Time: 02/04/21  8:52 PM   Specimen: Nasopharyngeal Swab; Nasopharyngeal(NP) swabs in vial transport medium  Result Value Ref Range Status   SARS Coronavirus 2 by RT PCR NEGATIVE NEGATIVE Final    Comment: (NOTE) SARS-CoV-2 target nucleic  acids are NOT DETECTED.  The SARS-CoV-2 RNA is generally detectable in upper respiratory specimens during the acute phase of infection. The lowest concentration of SARS-CoV-2 viral copies this assay can detect is 138 copies/mL. A negative result does not preclude SARS-Cov-2 infection and should not be used as the sole basis for treatment or other patient management decisions. A negative result may occur with  improper specimen collection/handling, submission of specimen other than nasopharyngeal swab, presence of viral mutation(s) within the areas targeted by this assay, and inadequate number of viral copies(<138 copies/mL). A negative result must be combined with clinical observations, patient history, and epidemiological information. The expected result is Negative.  Fact Sheet for Patients:  BloggerCourse.com  Fact Sheet for Healthcare Providers:  SeriousBroker.it  This test is no t yet approved or cleared by the [place] FDA and  has been authorized for detection and/or diagnosis of SARS-CoV-2 by FDA under an Emergency Use Authorization (EUA). This EUA will remain  in effect (meaning this test can be used) for the duration of the COVID-19 declaration under Section 564(b)(1)  of the Act, 21 U.S.C.section 360bbb-3(b)(1), unless the authorization is terminated  or revoked sooner.       Influenza A by PCR POSITIVE (A) NEGATIVE Final   Influenza B by PCR NEGATIVE NEGATIVE Final    Comment: (NOTE) The Xpert Xpress SARS-CoV-2/FLU/RSV plus assay is intended as an aid in the diagnosis of influenza from Nasopharyngeal swab specimens and should not be used as a sole basis for treatment. Nasal washings and aspirates are unacceptable for Xpert Xpress SARS-CoV-2/FLU/RSV testing.  Fact Sheet for Patients: BloggerCourse.com  Fact Sheet for Healthcare Providers: SeriousBroker.it  This test is not yet approved or cleared by the [place] FDA and has been authorized for detection and/or diagnosis of SARS-CoV-2 by FDA under an Emergency Use Authorization (EUA). This EUA will remain in effect (meaning this test can be used) for the duration of the COVID-19 declaration under Section 564(b)(1) of the Act, 21 U.S.C. section 360bbb-3(b)(1), unless the authorization is terminated or revoked.  Performed at [facility], [address] [place], [place] [address]      Radiology Studies: DG Chest 2 View  Result Date: 02/04/2021 CLINICAL DATA:  Shortness of breath and cough EXAM: CHEST - 2 VIEW COMPARISON:  08/30/2006 FINDINGS: Cardiac shadow is within normal limits. Lungs are hyperinflated without focal infiltrate. Old healed rib fractures are noted. No acute bony abnormality is seen. IMPRESSION: COPD without acute abnormality. Electronically Signed   By: Alcide Clever M.D.   On: 02/04/2021 21:52   CT Angio Chest Pulmonary Embolism (PE) W or WO Contrast  Result Date: 02/05/2021 CLINICAL DATA:  Shortness of breath for 1 week. EXAM: CT ANGIOGRAPHY CHEST WITH CONTRAST TECHNIQUE: Multidetector CT imaging of the chest was performed using the standard protocol during bolus administration of intravenous contrast. Multiplanar  CT image reconstructions and MIPs were obtained to evaluate the vascular anatomy. CONTRAST:  OMNIPAQUE IOHEXOL 350 MG/ML SOLN COMPARISON:  Chest radiograph from the same date. FINDINGS: Cardiovascular: Satisfactory opacification of the pulmonary arteries to the segmental level. No evidence of pulmonary embolism. Normal heart size. No pericardial effusion. Calcific atherosclerotic disease of the coronary arteries and aorta. Mediastinum/Nodes: No enlarged mediastinal, hilar, or axillary lymph nodes. Thyroid gland, trachea, and esophagus demonstrate no significant findings. Shotty sub pathologic mediastinal lymph nodes are present. Lungs/Pleura: Mild bronchial thickening. Mild upper lobe predominant emphysema. Bilateral dependent atelectasis in the lung bases. Upper Abdomen: No acute abnormality.  Musculoskeletal: No chest wall abnormality. No acute or significant osseous findings. Review of the MIP images confirms the above findings. IMPRESSION: 1. No evidence of pulmonary embolus. 2. Calcific atherosclerotic disease of the coronary arteries and aorta. 3. Mild upper lobe predominant emphysema. 4. Mild peribronchial thickening. 5. Bilateral dependent atelectasis in the lung bases. Aortic Atherosclerosis (ICD10-I70.0) and Emphysema (ICD10-J43.9). Electronically Signed   By: Ted Mcalpine M.D.   On: 02/05/2021 09:37   VAS [place] LOWER EXTREMITY VENOUS (DVT)  Result Date: 02/05/2021  Lower Venous DVT Study Patient Name:  ALYJAH LOVINGOOD  Date of Exam:   02/05/2021 Medical Rec #: [id number]      Accession #:    [id number] Date of Birth: 1955-11-08      Patient Gender: M Patient Age:   87 years Exam Location:  [facility] Procedure:      VAS [place] LOWER EXTREMITY VENOUS (DVT) Referring Phys: Sibyl Parr LAMA --------------------------------------------------------------------------------  Indications: Elevated D-dimer (17.72).  Comparison Study: No previous exams Performing Technologist: Jody Hill RVT, RDMS   Examination Guidelines: A complete evaluation includes B-mode imaging, spectral Doppler, color Doppler, and power Doppler as needed of all accessible portions of each vessel. Bilateral testing is considered an integral part of a complete examination. Limited examinations for reoccurring indications may be performed as noted. The reflux portion of the exam is performed with the patient in reverse Trendelenburg.  +---------+---------------+---------+-----------+----------+--------------+ RIGHT    CompressibilityPhasicitySpontaneityPropertiesThrombus Aging +---------+---------------+---------+-----------+----------+--------------+ CFV      Full           Yes      Yes                                 +---------+---------------+---------+-----------+----------+--------------+ SFJ      Full                                                        +---------+---------------+---------+-----------+----------+--------------+ FV Prox  Full           Yes      Yes                                 +---------+---------------+---------+-----------+----------+--------------+ FV Mid   Full           Yes      Yes                                 +---------+---------------+---------+-----------+----------+--------------+ FV DistalFull           Yes      Yes                                 +---------+---------------+---------+-----------+----------+--------------+ PFV      Full                                                        +---------+---------------+---------+-----------+----------+--------------+ POP      Full  Yes      Yes                                 +---------+---------------+---------+-----------+----------+--------------+ PTV      Full                                                        +---------+---------------+---------+-----------+----------+--------------+ PERO     Full                                                         +---------+---------------+---------+-----------+----------+--------------+   +---------+---------------+---------+-----------+----------+--------------+ LEFT     CompressibilityPhasicitySpontaneityPropertiesThrombus Aging +---------+---------------+---------+-----------+----------+--------------+ CFV      Full           Yes      Yes                                 +---------+---------------+---------+-----------+----------+--------------+ SFJ      Full                                                        +---------+---------------+---------+-----------+----------+--------------+ FV Prox  Full           Yes      Yes                                 +---------+---------------+---------+-----------+----------+--------------+ FV Mid   Full           Yes      Yes                                 +---------+---------------+---------+-----------+----------+--------------+ FV DistalFull           Yes      Yes                                 +---------+---------------+---------+-----------+----------+--------------+ PFV      Full                                                        +---------+---------------+---------+-----------+----------+--------------+ POP      Full           Yes      Yes                                 +---------+---------------+---------+-----------+----------+--------------+ PTV      Full                                                        +---------+---------------+---------+-----------+----------+--------------+  PERO     Full                                                        +---------+---------------+---------+-----------+----------+--------------+ Rouleaux flow seen in popliteal vein    Summary: BILATERAL: - No evidence of deep vein thrombosis seen in the lower extremities, bilaterally. - No evidence of superficial venous thrombosis in the lower extremities, bilaterally. -No evidence of popliteal cyst, bilaterally.    *See table(s) above for measurements and observations. Electronically signed by Sherald Hess MD on 02/05/2021 at 1:12:21 PM.    Final      LOS: 1 day   Lanae Boast, MD [facility]  02/06/2021, 10:03 AM

## 2021-02-06 NOTE — Evaluation (Signed)
Physical Therapy Evaluation Patient Details Name: Jimmy [name] MRN: [id number] DOB: 1955/06/21 Today's Date: 02/06/2021  History of Present Illness  Lavante Toso is a 65 y.o. male with history of tobacco abuse who has not been to a physician for many years does not take any medications presents to the ER because of ongoing shortness of breath for the last 1 week with productive cough and chest pain on coughing.  Patient has been producing whitish colored sputum.  Denies any recent travel or sick contacts   Clinical Impression  Patient received in bed, he is pleasant and without sob. Reports his breathing is back to normal. He is received on room air, O2 saturations at 90%. Patient is independent with all mobility in room. No lob. Ambulates with quick pace. His O2 sats did drop into the upper 80%s with ambulation but quickly returned to 90%s with rest. He reports he did not receive breakfast, assisted patient in calling dietary to get lunch tray ordered and brought him a drink and crackers. Patient does not require PT follow up at this time for mobility.        Recommendations for follow up therapy are one component of a multi-disciplinary discharge planning process, led by the attending physician.  Recommendations may be updated based on patient status, additional functional criteria and insurance authorization.  Follow Up Recommendations No PT follow up    Assistance Recommended at Discharge None  Functional Status Assessment Patient has not had a recent decline in their functional status  Equipment Recommendations  None recommended by PT    Recommendations for Other Services       Precautions / Restrictions Precautions Precaution Comments: low fall, impulsive Restrictions Weight Bearing Restrictions: No      Mobility  Bed Mobility Overal bed mobility: Independent                  Transfers Overall transfer level: Independent                 General transfer  comment: slgihtly impulsive but no LOB    Ambulation/Gait Ambulation/Gait assistance: Independent Gait Distance (Feet): 50 Feet Assistive device: None Gait Pattern/deviations: WFL(Within Functional Limits) Gait velocity: WNL     General Gait Details: quick pace, no lob. Patient reports his breathing is back to normal. O2 saturations did drop briefly to the upper 80%s, but quickly returned to 90%s.  Stairs            Wheelchair Mobility    Modified Rankin (Stroke Patients Only)       Balance Overall balance assessment: Independent                                           Pertinent Vitals/Pain Pain Assessment: No/denies pain    Home Living Family/patient expects to be discharged to:: Private residence Living Arrangements: Alone   Type of Home: House Home Access: Level entry       Home Layout: One level Home Equipment: None      Prior Function Prior Level of Function : Independent/Modified Independent             Mobility Comments: Independent at baseline ADLs Comments: independent     Hand Dominance        Extremity/Trunk Assessment   Upper Extremity Assessment Upper Extremity Assessment: Overall WFL for tasks assessed    Lower Extremity Assessment Lower  Extremity Assessment: Overall WFL for tasks assessed    Cervical / Trunk Assessment Cervical / Trunk Assessment: Normal  Communication   Communication: No difficulties  Cognition Arousal/Alertness: Awake/alert Behavior During Therapy: WFL for tasks assessed/performed Overall Cognitive Status: Within Functional Limits for tasks assessed                                          General Comments      Exercises     Assessment/Plan    PT Assessment Patient does not need any further PT services  PT Problem List Decreased activity tolerance       PT Treatment Interventions      PT Goals (Current goals can be found in the Care Plan section)   Acute Rehab PT Goals Patient Stated Goal: to return home PT Goal Formulation: With patient Time For Goal Achievement: 02/07/21 Potential to Achieve Goals: Good    Frequency     Barriers to discharge        Co-evaluation               AM-PAC PT "6 Clicks" Mobility  Outcome Measure Help needed turning from your back to your side while in a flat bed without using bedrails?: None Help needed moving from lying on your back to sitting on the side of a flat bed without using bedrails?: None Help needed moving to and from a bed to a chair (including a wheelchair)?: None Help needed standing up from a chair using your arms (e.g., wheelchair or bedside chair)?: None Help needed to walk in hospital room?: None Help needed climbing 3-5 steps with a railing? : None 6 Click Score: 24    End of Session   Activity Tolerance: Patient tolerated treatment well Patient left: in bed;with call bell/phone within reach;with bed alarm set Nurse Communication: Mobility status      Time: 1130-1158 PT Time Calculation (min) (ACUTE ONLY): 28 min   Charges:   PT Evaluation $PT Eval Low Complexity: 1 Low PT Treatments $Gait Training: 8-22 mins        [employer], PT, [employer] 02/06/21,12:27 PM

## 2021-02-06 NOTE — Progress Notes (Signed)
Ambulated with patient 145 feet in hallway without oxygen, 02sat 89-91%, hr 104, pt denied shortness of breath, noticed patient breathing heavier, encouraged pt to slow his walking and breath slower, md notified of patient ambulation, keep patient room air during night to reassess 11/8.

## 2021-02-07 ENCOUNTER — Other Ambulatory Visit (HOSPITAL_COMMUNITY): Payer: Self-pay

## 2021-02-07 MED ORDER — IPRATROPIUM-ALBUTEROL 0.5-2.5 (3) MG/3ML IN SOLN
3.0000 mL | Freq: Two times a day (BID) | RESPIRATORY_TRACT | Status: DC
  Administered 2021-02-07: 3 mL via RESPIRATORY_TRACT
  Filled 2021-02-07: qty 3

## 2021-02-07 MED ORDER — ALBUTEROL SULFATE HFA 108 (90 BASE) MCG/ACT IN AERS
2.0000 | INHALATION_SPRAY | Freq: Four times a day (QID) | RESPIRATORY_TRACT | 2 refills | Status: DC | PRN
  Filled 2021-02-07: qty 8.5, 25d supply, fill #0

## 2021-02-07 MED ORDER — AZITHROMYCIN 500 MG PO TABS
500.0000 mg | ORAL_TABLET | Freq: Every day | ORAL | 0 refills | Status: AC
  Filled 2021-02-07: qty 2, 2d supply, fill #0

## 2021-02-07 MED ORDER — OSELTAMIVIR PHOSPHATE 30 MG PO CAPS
30.0000 mg | ORAL_CAPSULE | Freq: Two times a day (BID) | ORAL | 0 refills | Status: AC
Start: 2021-02-07 — End: 2021-02-10
  Filled 2021-02-07: qty 6, 3d supply, fill #0

## 2021-02-07 MED ORDER — PREDNISONE 20 MG PO TABS
20.0000 mg | ORAL_TABLET | Freq: Every day | ORAL | 0 refills | Status: AC
  Filled 2021-02-07: qty 4, 4d supply, fill #0

## 2021-02-07 NOTE — Progress Notes (Signed)
Entered room for am assessment, noted pt 02 sat at 88% without good pleth, placed new oxygen probe on alternate finger, now registering 02 sat @ 89-90% with good pleth, pt denies shortness of breath, lungs diminished, no coughing noted.

## 2021-02-07 NOTE — Discharge Summary (Signed)
Physician Discharge Summary  Jimmy [name] AXK:[id number] DOB: June 24, 1955 DOA: 02/04/2021  PCP: Pcp, No  Admit date: 02/04/2021 Discharge date: 02/07/2021  Admitted From: home Disposition:  home  Recommendations for Outpatient Follow-up:  Follow up with PCP in 1-2 weeks Please obtain BMP/CBC in one week Please follow up on the following pending results:  Home Health:no  Equipment/Devices: none  Discharge Condition: Stable Code Status:   Code Status: Full Code Diet recommendation:  Diet Order             Diet regular Room service appropriate? Yes; Fluid consistency: Thin  Diet effective now                    Brief/Interim Summary:  Jimmy [name], 65 y.o. male with PMH of tobacco abuse who has not been to a physician for many years does not take any medications presents to the ER because of ongoing shortness of breath for the last 1 week with productive cough and chest pain on coughing.  Patient has been producing whitish colored sputum.  Denies any recent travel or sick contacts.  In ED-patient was found to be hypoxic requiring 3 L oxygen and was breathing on exam.  Chest x-ray shows features concerning for COPD.  EKG shows normal sinus rhythm.  Labs show mild hyponatremia.  Influenza A came back positive. He was managed with steroid, tamiflu, azithromycin.  He needed supplemental oxygen.  At this time he is clinically improved.  He was able to cough oxygen at rest but on ambulation dropped to 88% we will set him up for home oxygen suspect underlying COPD with longstanding chronic smoking.  He feels clinically well and ready for home today.  Discharge Diagnoses:   Acute bronchitis Influenza A infection Chronic smoking history possible COPD: Able to come off oxygen today .  At this time is clinically improved.  Continue oral steroid azithromycin Tamiflu supplemental oxygen on ambulation.  He is medically safe for discharge home today.   Acute hypoxic resp failure at this time  needing supplemental oxygen on ambulation. Home oxygen is being set up. Consults: TOC  Subjective: Alert awake oriented.  No cough no shortness of breath.  Feels ready for discharge home today Discharge Exam: Vitals:   02/07/21 0749 02/07/21 1219  BP: 115/75 125/79  Pulse: 69 70  Resp: 20 20  Temp: 97.6 F (36.4 C) 97.7 F (36.5 C)  SpO2: 91% 93%   General: Pt is alert, awake, not in acute distress Cardiovascular: RRR, S1/S2 +, no rubs, no gallops Respiratory: CTA bilaterally, no wheezing, no rhonchi Abdominal: Soft, NT, ND, bowel sounds + Extremities: no edema, no cyanosis  Discharge Instructions  Discharge Instructions     Call MD for:  difficulty breathing, headache or visual disturbances   Complete by: As directed    Discharge instructions   Complete by: As directed    Please call call MD or return to ER for similar or worsening recurring problem that brought you to hospital or if any fever,nausea/vomiting,abdominal pain, uncontrolled pain, chest pain,  shortness of breath or any other alarming symptoms.  Please follow-up your doctor as instructed in a week time and call the office for appointment.  Please avoid alcohol, smoking, or any other illicit substance and maintain healthy habits including taking your regular medications as prescribed.  You were cared for by a hospitalist during your hospital stay. If you have any questions about your discharge medications or the care you received while you were in  the hospital after you are discharged, you can call the unit and ask to speak with the hospitalist on call if the hospitalist that took care of you is not available.  Once you are discharged, your primary care physician will handle any further medical issues. Please note that NO REFILLS for any discharge medications will be authorized once you are discharged, as it is imperative that you return to your primary care physician (or establish a relationship with a primary  care physician if you do not have one) for your aftercare needs so that they can reassess your need for medications and monitor your lab values   Increase activity slowly   Complete by: As directed       Allergies as of 02/07/2021   No Known Allergies      Medication List     TAKE these medications    albuterol 108 (90 Base) MCG/ACT inhaler Commonly known as: VENTOLIN HFA Inhale 2 puffs into the lungs every 6 (six) hours as needed for wheezing or shortness of breath.   azithromycin 500 MG tablet Commonly known as: ZITHROMAX Take 1 tablet (500 mg total) by mouth daily at 6 PM for 2 days.   oseltamivir 30 MG capsule Commonly known as: TAMIFLU Take 1 capsule (30 mg total) by mouth 2 (two) times daily for 3 days.   predniSONE 20 MG tablet Commonly known as: DELTASONE Take 1 tablet (20 mg total) by mouth daily for 4 days.               Durable Medical Equipment  (From admission, onward)           Start     Ordered   02/07/21 1215  For home use only DME oxygen  Once       Question Answer Comment  Length of Need Lifetime   Mode or (Route) Nasal cannula   Liters per Minute 2   Frequency Continuous (stationary and portable oxygen unit needed)   Oxygen delivery system Gas      02/07/21 1214            Follow-up Information     [facility] Follow up in 1 week(s).   Contact information: [address] [place] [address] [phone]               No Known Allergies  The results of significant diagnostics from this hospitalization (including imaging, microbiology, ancillary and laboratory) are listed below for reference.    Microbiology: Recent Results (from the past 240 hour(s))  Resp Panel by RT-PCR (Flu A&B, Covid) Nasopharyngeal Swab     Status: Abnormal   Collection Time: 02/04/21  8:52 PM   Specimen: Nasopharyngeal Swab; Nasopharyngeal(NP) swabs in vial transport medium  Result Value  Ref Range Status   SARS Coronavirus 2 by RT PCR NEGATIVE NEGATIVE Final    Comment: (NOTE) SARS-CoV-2 target nucleic acids are NOT DETECTED.  The SARS-CoV-2 RNA is generally detectable in upper respiratory specimens during the acute phase of infection. The lowest concentration of SARS-CoV-2 viral copies this assay can detect is 138 copies/mL. A negative result does not preclude SARS-Cov-2 infection and should not be used as the sole basis for treatment or other patient management decisions. A negative result may occur with  improper specimen collection/handling, submission of specimen other than nasopharyngeal swab, presence of viral mutation(s) within the areas targeted by this assay, and inadequate number of viral copies(<138 copies/mL). A negative result must  be combined with clinical observations, patient history, and epidemiological information. The expected result is Negative.  Fact Sheet for Patients:  BloggerCourse.com  Fact Sheet for Healthcare Providers:  SeriousBroker.it  This test is no t yet approved or cleared by the [place] FDA and  has been authorized for detection and/or diagnosis of SARS-CoV-2 by FDA under an Emergency Use Authorization (EUA). This EUA will remain  in effect (meaning this test can be used) for the duration of the COVID-19 declaration under Section 564(b)(1) of the Act, 21 U.S.C.section 360bbb-3(b)(1), unless the authorization is terminated  or revoked sooner.       Influenza A by PCR POSITIVE (A) NEGATIVE Final   Influenza B by PCR NEGATIVE NEGATIVE Final    Comment: (NOTE) The Xpert Xpress SARS-CoV-2/FLU/RSV plus assay is intended as an aid in the diagnosis of influenza from Nasopharyngeal swab specimens and should not be used as a sole basis for treatment. Nasal washings and aspirates are unacceptable for Xpert Xpress SARS-CoV-2/FLU/RSV testing.  Fact Sheet for  Patients: BloggerCourse.com  Fact Sheet for Healthcare Providers: SeriousBroker.it  This test is not yet approved or cleared by the [place] FDA and has been authorized for detection and/or diagnosis of SARS-CoV-2 by FDA under an Emergency Use Authorization (EUA). This EUA will remain in effect (meaning this test can be used) for the duration of the COVID-19 declaration under Section 564(b)(1) of the Act, 21 U.S.C. section 360bbb-3(b)(1), unless the authorization is terminated or revoked.  Performed at [facility], [address] [place], [place] [address]     Procedures/Studies: DG Chest 2 View  Result Date: 02/04/2021 CLINICAL DATA:  Shortness of breath and cough EXAM: CHEST - 2 VIEW COMPARISON:  08/30/2006 FINDINGS: Cardiac shadow is within normal limits. Lungs are hyperinflated without focal infiltrate. Old healed rib fractures are noted. No acute bony abnormality is seen. IMPRESSION: COPD without acute abnormality. Electronically Signed   By: Alcide Clever M.D.   On: 02/04/2021 21:52   CT Angio Chest Pulmonary Embolism (PE) W or WO Contrast  Result Date: 02/05/2021 CLINICAL DATA:  Shortness of breath for 1 week. EXAM: CT ANGIOGRAPHY CHEST WITH CONTRAST TECHNIQUE: Multidetector CT imaging of the chest was performed using the standard protocol during bolus administration of intravenous contrast. Multiplanar CT image reconstructions and MIPs were obtained to evaluate the vascular anatomy. CONTRAST:  OMNIPAQUE IOHEXOL 350 MG/ML SOLN COMPARISON:  Chest radiograph from the same date. FINDINGS: Cardiovascular: Satisfactory opacification of the pulmonary arteries to the segmental level. No evidence of pulmonary embolism. Normal heart size. No pericardial effusion. Calcific atherosclerotic disease of the coronary arteries and aorta. Mediastinum/Nodes: No enlarged mediastinal, hilar, or axillary lymph nodes. Thyroid gland,  trachea, and esophagus demonstrate no significant findings. Shotty sub pathologic mediastinal lymph nodes are present. Lungs/Pleura: Mild bronchial thickening. Mild upper lobe predominant emphysema. Bilateral dependent atelectasis in the lung bases. Upper Abdomen: No acute abnormality. Musculoskeletal: No chest wall abnormality. No acute or significant osseous findings. Review of the MIP images confirms the above findings. IMPRESSION: 1. No evidence of pulmonary embolus. 2. Calcific atherosclerotic disease of the coronary arteries and aorta. 3. Mild upper lobe predominant emphysema. 4. Mild peribronchial thickening. 5. Bilateral dependent atelectasis in the lung bases. Aortic Atherosclerosis (ICD10-I70.0) and Emphysema (ICD10-J43.9). Electronically Signed   By: Ted Mcalpine M.D.   On: 02/05/2021 09:37   VAS [place] LOWER EXTREMITY VENOUS (DVT)  Result Date: 02/05/2021  Lower Venous DVT Study Patient Name:  NEIMAN ROOTS  Date of  Exam:   02/05/2021 Medical Rec #: [id number]      Accession #:    [id number] Date of Birth: 1955/12/07      Patient Gender: M Patient Age:   66 years Exam Location:  [facility] Procedure:      VAS [place] LOWER EXTREMITY VENOUS (DVT) Referring Phys: Sibyl Parr LAMA --------------------------------------------------------------------------------  Indications: Elevated D-dimer (17.72).  Comparison Study: No previous exams Performing Technologist: Jody Hill RVT, RDMS  Examination Guidelines: A complete evaluation includes B-mode imaging, spectral Doppler, color Doppler, and power Doppler as needed of all accessible portions of each vessel. Bilateral testing is considered an integral part of a complete examination. Limited examinations for reoccurring indications may be performed as noted. The reflux portion of the exam is performed with the patient in reverse Trendelenburg.  +---------+---------------+---------+-----------+----------+--------------+ RIGHT     CompressibilityPhasicitySpontaneityPropertiesThrombus Aging +---------+---------------+---------+-----------+----------+--------------+ CFV      Full           Yes      Yes                                 +---------+---------------+---------+-----------+----------+--------------+ SFJ      Full                                                        +---------+---------------+---------+-----------+----------+--------------+ FV Prox  Full           Yes      Yes                                 +---------+---------------+---------+-----------+----------+--------------+ FV Mid   Full           Yes      Yes                                 +---------+---------------+---------+-----------+----------+--------------+ FV DistalFull           Yes      Yes                                 +---------+---------------+---------+-----------+----------+--------------+ PFV      Full                                                        +---------+---------------+---------+-----------+----------+--------------+ POP      Full           Yes      Yes                                 +---------+---------------+---------+-----------+----------+--------------+ PTV      Full                                                        +---------+---------------+---------+-----------+----------+--------------+  PERO     Full                                                        +---------+---------------+---------+-----------+----------+--------------+   +---------+---------------+---------+-----------+----------+--------------+ LEFT     CompressibilityPhasicitySpontaneityPropertiesThrombus Aging +---------+---------------+---------+-----------+----------+--------------+ CFV      Full           Yes      Yes                                 +---------+---------------+---------+-----------+----------+--------------+ SFJ      Full                                                         +---------+---------------+---------+-----------+----------+--------------+ FV Prox  Full           Yes      Yes                                 +---------+---------------+---------+-----------+----------+--------------+ FV Mid   Full           Yes      Yes                                 +---------+---------------+---------+-----------+----------+--------------+ FV DistalFull           Yes      Yes                                 +---------+---------------+---------+-----------+----------+--------------+ PFV      Full                                                        +---------+---------------+---------+-----------+----------+--------------+ POP      Full           Yes      Yes                                 +---------+---------------+---------+-----------+----------+--------------+ PTV      Full                                                        +---------+---------------+---------+-----------+----------+--------------+ PERO     Full                                                        +---------+---------------+---------+-----------+----------+--------------+  Rouleaux flow seen in popliteal vein    Summary: BILATERAL: - No evidence of deep vein thrombosis seen in the lower extremities, bilaterally. - No evidence of superficial venous thrombosis in the lower extremities, bilaterally. -No evidence of popliteal cyst, bilaterally.   *See table(s) above for measurements and observations. Electronically signed by Sherald Hess MD on 02/05/2021 at 1:12:21 PM.    Final     Labs: BNP (last 3 results) No results for input(s): BNP in the last 8760 hours. Basic Metabolic Panel: Recent Labs  Lab 02/04/21 2127 02/05/21 0129 02/06/21 0142  NA 132*  --  136  K 3.7  --  3.6  CL 98  --  106  CO2 24  --  23  GLUCOSE 112*  --  147*  BUN 21  --  22  CREATININE 1.16 1.07 0.99  CALCIUM 8.7*  --  8.2*   Liver Function Tests: Recent  Labs  Lab 02/04/21 2127  AST 32  ALT 21  ALKPHOS 63  BILITOT 0.8  PROT 7.3  ALBUMIN 3.5   No results for input(s): LIPASE, AMYLASE in the last 168 hours. No results for input(s): AMMONIA in the last 168 hours. CBC: Recent Labs  Lab 02/04/21 2127 02/05/21 0129  WBC 6.9 6.8  HGB 16.6 15.7  HCT 47.1 45.1  MCV 91.6 90.9  PLT 255 243   Cardiac Enzymes: No results for input(s): CKTOTAL, CKMB, CKMBINDEX, TROPONINI in the last 168 hours. BNP: Invalid input(s): POCBNP CBG: No results for input(s): GLUCAP in the last 168 hours. D-Dimer Recent Labs    02/05/21 0526  DDIMER 17.72*   Hgb A1c No results for input(s): HGBA1C in the last 72 hours. Lipid Profile No results for input(s): CHOL, HDL, LDLCALC, TRIG, CHOLHDL, LDLDIRECT in the last 72 hours. Thyroid function studies No results for input(s): TSH, T4TOTAL, T3FREE, THYROIDAB in the last 72 hours.  Invalid input(s): FREET3 Anemia work up No results for input(s): VITAMINB12, FOLATE, FERRITIN, TIBC, IRON, RETICCTPCT in the last 72 hours. Urinalysis No results found for: COLORURINE, APPEARANCEUR, LABSPEC, PHURINE, GLUCOSEU, HGBUR, BILIRUBINUR, KETONESUR, PROTEINUR, UROBILINOGEN, NITRITE, LEUKOCYTESUR Sepsis Labs Invalid input(s): PROCALCITONIN,  WBC,  LACTICIDVEN Microbiology Recent Results (from the past 240 hour(s))  Resp Panel by RT-PCR (Flu A&B, Covid) Nasopharyngeal Swab     Status: Abnormal   Collection Time: 02/04/21  8:52 PM   Specimen: Nasopharyngeal Swab; Nasopharyngeal(NP) swabs in vial transport medium  Result Value Ref Range Status   SARS Coronavirus 2 by RT PCR NEGATIVE NEGATIVE Final    Comment: (NOTE) SARS-CoV-2 target nucleic acids are NOT DETECTED.  The SARS-CoV-2 RNA is generally detectable in upper respiratory specimens during the acute phase of infection. The lowest concentration of SARS-CoV-2 viral copies this assay can detect is 138 copies/mL. A negative result does not preclude  SARS-Cov-2 infection and should not be used as the sole basis for treatment or other patient management decisions. A negative result may occur with  improper specimen collection/handling, submission of specimen other than nasopharyngeal swab, presence of viral mutation(s) within the areas targeted by this assay, and inadequate number of viral copies(<138 copies/mL). A negative result must be combined with clinical observations, patient history, and epidemiological information. The expected result is Negative.  Fact Sheet for Patients:  BloggerCourse.com  Fact Sheet for Healthcare Providers:  SeriousBroker.it  This test is no t yet approved or cleared by the [place] FDA and  has been authorized for detection and/or diagnosis of SARS-CoV-2 by FDA under an Emergency  Use Authorization (EUA). This EUA will remain  in effect (meaning this test can be used) for the duration of the COVID-19 declaration under Section 564(b)(1) of the Act, 21 U.S.C.section 360bbb-3(b)(1), unless the authorization is terminated  or revoked sooner.       Influenza A by PCR POSITIVE (A) NEGATIVE Final   Influenza B by PCR NEGATIVE NEGATIVE Final    Comment: (NOTE) The Xpert Xpress SARS-CoV-2/FLU/RSV plus assay is intended as an aid in the diagnosis of influenza from Nasopharyngeal swab specimens and should not be used as a sole basis for treatment. Nasal washings and aspirates are unacceptable for Xpert Xpress SARS-CoV-2/FLU/RSV testing.  Fact Sheet for Patients: BloggerCourse.com  Fact Sheet for Healthcare Providers: SeriousBroker.it  This test is not yet approved or cleared by the [place] FDA and has been authorized for detection and/or diagnosis of SARS-CoV-2 by FDA under an Emergency Use Authorization (EUA). This EUA will remain in effect (meaning this test can be used) for the duration of  the COVID-19 declaration under Section 564(b)(1) of the Act, 21 U.S.C. section 360bbb-3(b)(1), unless the authorization is terminated or revoked.  Performed at [facility], [address] [place], [place] [address]      Time coordinating discharge: 25 minutes  SIGNED: Lanae Boast, MD  [facility] 02/07/2021, 12:22 PM  If 7PM-7AM, please contact night-coverage [email or web address]

## 2021-02-07 NOTE — TOC Progression Note (Addendum)
Transition of Care [facility]) - Progression Note    Patient Details  Name: Hannan Hutmacher MRN: [id number] Date of Birth: 1955/08/19  Transition of Care [facility]) CM/SW Contact  Beckie Busing, RN Phone Number:[phone]  02/07/2021, 9:04 AM  Clinical Narrative:    MATCH form completed to provide uninsured patient with 30 day supply of meds before discharge. TOC has need updated. Meds to be delivered to the patients room.   1225 TOC consulted for patient discharging home with no insurance and in need of home O2. O2 saturation screen has been documented. CM called order for charity O2 to [employer] [facility]. O2 to be delivered to the bedside. Bedside nurse has been updated.        Expected Discharge Plan and Services           Expected Discharge Date: 02/07/21                                     Social Determinants of Health (SDOH) Interventions    Readmission Risk Interventions No flowsheet data found.

## 2021-02-07 NOTE — Progress Notes (Signed)
SATURATION QUALIFICATIONS: (This note is used to comply with regulatory documentation for home oxygen)  Patient Saturations on Room Air at Rest = 89%  Patient Saturations on Room Air while Ambulating = 88%  Patient Saturations on 2Liters of oxygen while Ambulating = 94%  Please briefly explain why patient needs home oxygen:

## 2021-02-07 NOTE — TOC Progression Note (Deleted)
Transition of Care [facility]) - Progression Note    Patient Details  Name: Jimmy [name] MRN: [id number] Date of Birth: 1956/01/19  Transition of Care [facility]) CM/SW Contact  Beckie Busing, RN Phone Number:[phone]  02/07/2021, 12:17 PM  Clinical Narrative:    [facility] consulted for patient discharging home with no insurance and in need of home O2. O2 saturation screen has been documented. CM called order for charity O2 to [facility]. O2 to be delivered to the bedside.         Expected Discharge Plan and Services           Expected Discharge Date: 02/07/21               DME Arranged: Oxygen DME Agency: [employer] Date DME Agency Contacted: 02/07/21 Time DME Agency Contacted: 1217 Representative spoke with at DME Agency: Silvio Pate             Social Determinants of Health (SDOH) Interventions    Readmission Risk Interventions No flowsheet data found.

## 2021-02-07 NOTE — Progress Notes (Signed)
Spoke with roberto, rrt in reference to patient s02 sat between 89-90% on room air, rrt noted patient is not in noticeable breathing distress at this time, so ok to keep on room air at this time, we will monitor.

## 2021-02-07 NOTE — Progress Notes (Addendum)
Discharge education provided to packet, all questions and concerns addressed, pt stated he refused to take the oxygen because it was too big and that he walks everywhere he goes and can't pull that along, pt received bus pass via Child psychotherapist, escorted to main exit with staff.

## 2022-03-10 ENCOUNTER — Emergency Department (HOSPITAL_COMMUNITY): Payer: Medicaid Other

## 2022-03-10 ENCOUNTER — Encounter (HOSPITAL_COMMUNITY): Payer: Self-pay | Admitting: Internal Medicine

## 2022-03-10 ENCOUNTER — Observation Stay (HOSPITAL_BASED_OUTPATIENT_CLINIC_OR_DEPARTMENT_OTHER): Payer: Medicaid Other

## 2022-03-10 ENCOUNTER — Other Ambulatory Visit: Payer: Self-pay

## 2022-03-10 ENCOUNTER — Inpatient Hospital Stay (HOSPITAL_COMMUNITY)
Admission: EM | Admit: 2022-03-10 | Discharge: 2022-03-15 | DRG: 190 | Disposition: A | Discharge status: Home / Self Care | Payer: Medicaid Other | Attending: Internal Medicine | Admitting: Internal Medicine

## 2022-03-10 DIAGNOSIS — Z1152 Encounter for screening for COVID-19: Secondary | ICD-10-CM

## 2022-03-10 DIAGNOSIS — R011 Cardiac murmur, unspecified: Secondary | ICD-10-CM | POA: Diagnosis present

## 2022-03-10 DIAGNOSIS — J9601 Acute respiratory failure with hypoxia: Secondary | ICD-10-CM | POA: Diagnosis present

## 2022-03-10 DIAGNOSIS — F1011 Alcohol abuse, in remission: Secondary | ICD-10-CM | POA: Diagnosis present

## 2022-03-10 DIAGNOSIS — E039 Hypothyroidism, unspecified: Secondary | ICD-10-CM | POA: Diagnosis present

## 2022-03-10 DIAGNOSIS — J441 Chronic obstructive pulmonary disease with (acute) exacerbation: Principal | ICD-10-CM | POA: Diagnosis present

## 2022-03-10 DIAGNOSIS — F17210 Nicotine dependence, cigarettes, uncomplicated: Secondary | ICD-10-CM | POA: Diagnosis present

## 2022-03-10 DIAGNOSIS — Z79890 Hormone replacement therapy (postmenopausal): Secondary | ICD-10-CM

## 2022-03-10 DIAGNOSIS — E876 Hypokalemia: Secondary | ICD-10-CM

## 2022-03-10 DIAGNOSIS — Z5900 Homelessness unspecified: Secondary | ICD-10-CM

## 2022-03-10 DIAGNOSIS — Z720 Tobacco use: Secondary | ICD-10-CM | POA: Diagnosis present

## 2022-03-10 LAB — HEPATIC FUNCTION PANEL
ALT: 22 U/L (ref 0–44)
AST: 28 U/L (ref 15–41)
Albumin: 3.3 g/dL — ABNORMAL LOW (ref 3.5–5.0)
Alkaline Phosphatase: 53 U/L (ref 38–126)
Bilirubin, Direct: 0.1 mg/dL (ref 0.0–0.2)
Indirect Bilirubin: 0.2 mg/dL — ABNORMAL LOW (ref 0.3–0.9)
Total Bilirubin: 0.3 mg/dL (ref 0.3–1.2)
Total Protein: 6.8 g/dL (ref 6.5–8.1)

## 2022-03-10 LAB — RESPIRATORY PANEL BY PCR

## 2022-03-10 LAB — CBC WITH DIFFERENTIAL/PLATELET
Abs Immature Granulocytes: 0.03 10*3/uL (ref 0.00–0.07)
Basophils Absolute: 0.1 10*3/uL (ref 0.0–0.1)
Basophils Relative: 1 %
Eosinophils Absolute: 0.2 10*3/uL (ref 0.0–0.5)
Eosinophils Relative: 2 %
HCT: 42.2 % (ref 39.0–52.0)
Hemoglobin: 14.2 g/dL (ref 13.0–17.0)
Immature Granulocytes: 0 %
Lymphocytes Relative: 21 %
Lymphs Abs: 1.5 10*3/uL (ref 0.7–4.0)
MCH: 31.7 pg (ref 26.0–34.0)
MCHC: 33.6 g/dL (ref 30.0–36.0)
MCV: 94.2 fL (ref 80.0–100.0)
Monocytes Absolute: 0.6 10*3/uL (ref 0.1–1.0)
Monocytes Relative: 9 %
Neutro Abs: 4.7 10*3/uL (ref 1.7–7.7)
Neutrophils Relative %: 67 %
Platelets: 344 10*3/uL (ref 150–400)
RBC: 4.48 MIL/uL (ref 4.22–5.81)
RDW: 13.1 % (ref 11.5–15.5)
WBC: 7.1 10*3/uL (ref 4.0–10.5)
nRBC: 0 % (ref 0.0–0.2)

## 2022-03-10 LAB — RAPID URINE DRUG SCREEN, HOSP PERFORMED
Amphetamines: NOT DETECTED
Barbiturates: NOT DETECTED
Benzodiazepines: NOT DETECTED
Cocaine: POSITIVE — AB
Opiates: NOT DETECTED
Tetrahydrocannabinol: NOT DETECTED

## 2022-03-10 LAB — ECHOCARDIOGRAM COMPLETE
Area-P 1/2: 2 cm2
Calc EF: 79.9 %
Height: 61 in
S' Lateral: 2.9 cm
Single Plane A2C EF: 83.3 %
Single Plane A4C EF: 74.1 %
Weight: 1888 oz

## 2022-03-10 LAB — BASIC METABOLIC PANEL
Anion gap: 9 (ref 5–15)
BUN: 20 mg/dL (ref 8–23)
CO2: 24 mmol/L (ref 22–32)
Calcium: 8.5 mg/dL — ABNORMAL LOW (ref 8.9–10.3)
Chloride: 107 mmol/L (ref 98–111)
Creatinine, Ser: 0.85 mg/dL (ref 0.61–1.24)
GFR, Estimated: >60 mL/min (ref >60)
Glucose, Bld: 126 mg/dL — ABNORMAL HIGH (ref 70–99)
Potassium: 3.3 mmol/L — ABNORMAL LOW (ref 3.5–5.1)
Sodium: 140 mmol/L (ref 135–145)

## 2022-03-10 LAB — MAGNESIUM: Magnesium: 2.2 mg/dL (ref 1.7–2.4)

## 2022-03-10 LAB — TROPONIN I (HIGH SENSITIVITY)
Troponin I (High Sensitivity): 3 ng/L (ref <18)
Troponin I (High Sensitivity): 3 ng/L (ref <18)

## 2022-03-10 LAB — EXPECTORATED SPUTUM ASSESSMENT W GRAM STAIN, RFLX TO RESP C

## 2022-03-10 LAB — TSH: TSH: 10.747 u[IU]/mL — ABNORMAL HIGH (ref 0.350–4.500)

## 2022-03-10 LAB — RESP PANEL BY RT-PCR (FLU A&B, COVID) ARPGX2
Influenza A by PCR: NEGATIVE
Influenza B by PCR: NEGATIVE
SARS Coronavirus 2 by RT PCR: NEGATIVE

## 2022-03-10 LAB — BRAIN NATRIURETIC PEPTIDE: B Natriuretic Peptide: 23.6 pg/mL (ref 0.0–100.0)

## 2022-03-10 MED ORDER — ALBUTEROL SULFATE (2.5 MG/3ML) 0.083% IN NEBU
5.0000 mg | INHALATION_SOLUTION | Freq: Once | RESPIRATORY_TRACT | Status: AC
  Administered 2022-03-10: 5 mg via RESPIRATORY_TRACT
  Filled 2022-03-10: qty 6

## 2022-03-10 MED ORDER — IPRATROPIUM-ALBUTEROL 0.5-2.5 (3) MG/3ML IN SOLN
3.0000 mL | Freq: Four times a day (QID) | RESPIRATORY_TRACT | Status: DC
  Administered 2022-03-10 (×3): 3 mL via RESPIRATORY_TRACT
  Filled 2022-03-10 (×3): qty 3

## 2022-03-10 MED ORDER — NICOTINE 14 MG/24HR TD PT24
14.0000 mg | MEDICATED_PATCH | Freq: Every day | TRANSDERMAL | Status: DC
  Administered 2022-03-10 – 2022-03-15 (×6): 14 mg via TRANSDERMAL
  Filled 2022-03-10 (×6): qty 1

## 2022-03-10 MED ORDER — SODIUM CHLORIDE 0.9 % IV SOLN
1.0000 g | INTRAVENOUS | Status: DC
  Administered 2022-03-11: 1 g via INTRAVENOUS
  Filled 2022-03-10: qty 10

## 2022-03-10 MED ORDER — ENOXAPARIN SODIUM 40 MG/0.4ML IJ SOSY
40.0000 mg | PREFILLED_SYRINGE | INTRAMUSCULAR | Status: DC
  Administered 2022-03-10 – 2022-03-14 (×5): 40 mg via SUBCUTANEOUS
  Filled 2022-03-10 (×6): qty 0.4

## 2022-03-10 MED ORDER — POTASSIUM CHLORIDE CRYS ER 20 MEQ PO TBCR
40.0000 meq | EXTENDED_RELEASE_TABLET | Freq: Once | ORAL | Status: AC
  Administered 2022-03-10: 40 meq via ORAL
  Filled 2022-03-10: qty 2

## 2022-03-10 MED ORDER — METHYLPREDNISOLONE SODIUM SUCC 125 MG IJ SOLR
125.0000 mg | INTRAMUSCULAR | Status: DC
  Administered 2022-03-11: 125 mg via INTRAVENOUS
  Filled 2022-03-10: qty 2

## 2022-03-10 MED ORDER — ACETAMINOPHEN 325 MG PO TABS
650.0000 mg | ORAL_TABLET | Freq: Four times a day (QID) | ORAL | Status: DC | PRN
  Administered 2022-03-10 – 2022-03-14 (×4): 650 mg via ORAL
  Filled 2022-03-10 (×4): qty 2

## 2022-03-10 MED ORDER — ALBUTEROL SULFATE (2.5 MG/3ML) 0.083% IN NEBU
2.5000 mg | INHALATION_SOLUTION | RESPIRATORY_TRACT | Status: DC | PRN
  Administered 2022-03-11 – 2022-03-14 (×6): 2.5 mg via RESPIRATORY_TRACT
  Filled 2022-03-10 (×6): qty 3

## 2022-03-10 MED ORDER — MAGNESIUM SULFATE 2 GM/50ML IV SOLN
2.0000 g | Freq: Once | INTRAVENOUS | Status: AC
  Administered 2022-03-10: 2 g via INTRAVENOUS
  Filled 2022-03-10: qty 50

## 2022-03-10 MED ORDER — IPRATROPIUM-ALBUTEROL 0.5-2.5 (3) MG/3ML IN SOLN
3.0000 mL | Freq: Three times a day (TID) | RESPIRATORY_TRACT | Status: DC
  Administered 2022-03-11 – 2022-03-15 (×14): 3 mL via RESPIRATORY_TRACT
  Filled 2022-03-10 (×14): qty 3

## 2022-03-10 MED ORDER — SODIUM CHLORIDE 0.9 % IV SOLN: INTRAVENOUS | Status: AC

## 2022-03-10 MED ORDER — METHYLPREDNISOLONE SODIUM SUCC 125 MG IJ SOLR
125.0000 mg | INTRAMUSCULAR | Status: AC
  Administered 2022-03-10: 125 mg via INTRAVENOUS
  Filled 2022-03-10: qty 2

## 2022-03-10 MED ORDER — ACETAMINOPHEN 650 MG RE SUPP: 650.0000 mg | Freq: Four times a day (QID) | RECTAL | Status: DC | PRN

## 2022-03-10 MED ORDER — SODIUM CHLORIDE 0.9 % IV SOLN
1.0000 g | Freq: Once | INTRAVENOUS | Status: AC
  Administered 2022-03-10: 1 g via INTRAVENOUS
  Filled 2022-03-10: qty 10

## 2022-03-10 MED ORDER — GUAIFENESIN ER 600 MG PO TB12
600.0000 mg | ORAL_TABLET | Freq: Two times a day (BID) | ORAL | Status: DC
  Administered 2022-03-10 – 2022-03-15 (×11): 600 mg via ORAL
  Filled 2022-03-10 (×11): qty 1

## 2022-03-10 NOTE — Assessment & Plan Note (Addendum)
Secondary to #1 Required 6L has weaned down to 2-3L Continue plan for COPD exacerbation and wean as tolerated Also checking echo with loud systolic murmur  Check UDS

## 2022-03-10 NOTE — H&P (Signed)
History and Physical    Patient: Jimmy [name] XLK:[id number] DOB: Nov 29, 1955 DOA: 03/10/2022 DOS: the patient was seen and examined on 03/10/2022 PCP: Pcp, No  Patient coming from: Home - lives alone   Chief Complaint: shortness of breath   HPI: Jimmy [name] is a 66 y.o. male with medical history significant of tobacco abuse who presented to ED with complaints of sudden onset shortness of breath, chest tightness, wheezing and cough that started last night when he was at work.  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 He was wheezing as well.  He states he has not been sick, no fever/chills or known sick contacts. His cough is productive with increased viscosity and green colored sputum. He has no known medical history and has never been told her has COPD. He takes no px drugs. He has a remote history of alcohol abuse, but has been sober since 1995. Still smokes, but has cut down to 1/3-1/2 PPD.    Denies any fever/chills, vision changes/headaches, palpitations, abdominal pain, N/V/D, dysuria or leg swelling.     ER Course:  vitals: afebrile, bp: 121/82, HR: 98, RR: 24, oxygen: 100% on 6L Pertinent labs: covid/flu negative, potassium: 3.3,  CXR: no acute finding In ED: given albuterol nebs, magnesium, rocephin and solumedrol. TRH asked to admit.    Review of Systems: As mentioned in the history of present illness. All other systems reviewed and are negative. No past medical history on file. Past Surgical History:  Procedure Laterality Date   APPENDECTOMY     Social History:  reports that he has been smoking cigarettes. He has been smoking an average of .5 packs per day. He has never used smokeless tobacco. He reports that he does not drink alcohol and does not use drugs.  No Known Allergies  Family History  Problem Relation Age of Onset   CAD Neg Hx     Prior to Admission medications    Medication Sig Start Date End Date Taking? Authorizing Provider  albuterol (VENTOLIN HFA) 108 (90 Base) MCG/ACT inhaler Inhale 2 puffs into the lungs every 6 (six) hours as needed for wheezing or shortness of breath. Patient not taking: Reported on 03/10/2022 02/07/21   Lanae Boast, MD    Physical Exam: Vitals:   03/10/22 0515 03/10/22 0530 03/10/22 0615 03/10/22 0744  BP: 115/64 122/64 126/62 122/77  Pulse: 61 (!) 58 62 81  Resp: 14 (!) 21 15 (!) 23  Temp:    97.6 F (36.4 C)  TempSrc:    Oral  SpO2: 94% 97% 96% 94%  Weight:      Height:       General:  Appears calm and comfortable and is in NA. Cachetic appearing.  Eyes:  PERRL, EOMI, normal lids, iris ENT:  grossly normal hearing, lips & tongue, mmm; poor dentition Neck:  no LAD, masses or thyromegaly; no carotid bruits Cardiovascular:  RRR, harsh systolic murmur.  No LE edema.  Respiratory:   lungs clear, but poor air movement throughout. Normal respiratory effort. Abdomen:  soft, NT, ND, NABS Back:   normal alignment, no CVAT Skin:  no rash or induration seen on limited exam Musculoskeletal:  grossly normal tone BUE/BLE, good ROM, no bony abnormality Lower extremity:  No LE edema.  Limited foot exam with no ulcerations.  2+ distal pulses. Psychiatric:  grossly normal mood and affect, speech fluent and appropriate, AOx3  Neurologic:  CN 2-12 grossly intact, moves all extremities in coordinated fashion, sensation intact   Radiological Exams on Admission: Independently reviewed - see discussion in A/P where applicable  DG Chest Portable 1 View  Result Date: 03/10/2022 CLINICAL DATA:  Shortness of breath EXAM: PORTABLE CHEST 1 VIEW COMPARISON:  02/05/2021 FINDINGS: Cardiac shadow is within normal limits. Lungs are well aerated without focal infiltrate or effusion. No bony abnormality is noted. IMPRESSION: No acute abnormality seen. Electronically Signed   By: Inez Catalina M.D.   On: 03/10/2022 02:50    EKG: Independently  reviewed.  NSR with rate 71; nonspecific ST changes with no evidence of acute ischemia   Labs on Admission: I have personally reviewed the available labs and imaging studies at the time of the admission.  Pertinent labs:   Potassium: 3.3.   Assessment and Plan: Principal Problem:   COPD with acute exacerbation ([place]) Active Problems:   Acute respiratory failure with hypoxia ([facility])   Systolic murmur   Hypokalemia   Tobacco abuse    Assessment and Plan: * COPD with acute exacerbation ([place]) 66 year old male presenting with 1 day history of acute onset of shortness of breath, cough, wheezing, chest tightness with no acute process on CXR found to be in COPD exacerbation with acute respiratory failure with hypoxia.  -place in obs -troponin/bnp wnl  -CXR with no acute finding  -3/3 cardinal symptoms, continue rocephin q 24 hours -sputum culture -RVP pending. Covid/flu negative.  -scheduled duonebs, PRN albuterol -gentle, time limited IVF -continue IV solumedrol for another day -mucinex BID/IS to bedside -discussed smoking cessation -needs PFT outpatient to optimize treatment for his undiagnosed COPD  -wean oxygen as tolerated   Acute respiratory failure with hypoxia ([place]) Secondary to #1 Required 6L has weaned down to 2-3L Continue plan for COPD exacerbation and wean as tolerated Also checking echo with loud systolic murmur  Check UDS  Systolic murmur Harsh systolic murmur in setting of respiratory failure likely secondary to COPD exacerbation; however, warrants echo Echo pending Bnp wnl  Denies any dizziness/lightheadedness  Hypokalemia Check magnesium Given K-dur 83meq x1 Trend   Tobacco abuse He has cut down, encouraged him to continue to decrease and stop Nicotine patch     Advance Care Planning:   Code Status: Full Code   Consults: none   DVT Prophylaxis: lovenox   Family Communication: none  Severity of Illness: The appropriate patient status for this  patient is OBSERVATION. Observation status is judged to be reasonable and necessary in order to provide the required intensity of service to ensure the patient's safety. The patient's presenting symptoms, physical exam findings, and initial radiographic and laboratory data in the context of their medical condition is felt to place them at decreased risk for further clinical deterioration. Furthermore, it is anticipated that the patient will be medically stable for discharge from the hospital within 2 midnights of admission.   Author: Orma Flaming, MD 03/10/2022 8:02 AM  For on call review [email or web address].

## 2022-03-10 NOTE — ED Provider Notes (Signed)
[facility]-EMERGENCY DEPT Provider Note   CSN: [id number] Arrival date & time: 03/10/22  0224     History  Chief Complaint  Patient presents with   Shortness of Breath    Jimmy [name] is a 66 y.o. male.  The history is provided by the patient.  Shortness of Breath Severity:  Severe Onset quality:  Gradual Timing:  Constant Progression:  Worsening Chronicity:  Recurrent Context: not fumes   Relieved by:  Nothing Worsened by:  Nothing Ineffective treatments:  None tried Associated symptoms: cough and wheezing   Associated symptoms: no abdominal pain, no chest pain, no diaphoresis and no fever   Risk factors: no prolonged immobilization   Risk factors comment:  60 pack year history quit one week ago      Home Medications Prior to Admission medications   Medication Sig Start Date End Date Taking? Authorizing Provider  albuterol (VENTOLIN HFA) 108 (90 Base) MCG/ACT inhaler Inhale 2 puffs into the lungs every 6 (six) hours as needed for wheezing or shortness of breath. Patient not taking: Reported on 03/10/2022 02/07/21   Lanae Boast, MD      Allergies    Patient has no known allergies.    Review of Systems   Review of Systems  Constitutional:  Negative for diaphoresis and fever.  HENT:  Negative for facial swelling.   Eyes:  Negative for redness.  Respiratory:  Positive for cough, shortness of breath and wheezing.   Cardiovascular:  Negative for chest pain, palpitations and leg swelling.  Gastrointestinal:  Negative for abdominal pain.  All other systems reviewed and are negative.   Physical Exam Updated Vital Signs BP 126/62   Pulse 62   Temp 97.7 F (36.5 C) (Oral)   Resp 15   Ht 5\' 1"  (1.549 m)   Wt 53.5 kg   SpO2 96%   BMI 22.30 kg/m  Physical Exam Vitals and nursing note reviewed. Exam conducted with a chaperone present.  Constitutional:      General: He is not in acute distress.    Appearance: He is well-developed. He is not  diaphoretic.  HENT:     Head: Normocephalic and atraumatic.  Eyes:     Conjunctiva/sclera: Conjunctivae normal.     Pupils: Pupils are equal, round, and reactive to light.  Cardiovascular:     Rate and Rhythm: Normal rate and regular rhythm.     Pulses: Normal pulses.     Heart sounds: Normal heart sounds.  Pulmonary:     Effort: Pulmonary effort is normal. Tachypnea present.     Breath sounds: Decreased air movement present. Wheezing present. No rales.  Abdominal:     General: Bowel sounds are normal.     Palpations: Abdomen is soft.     Tenderness: There is no abdominal tenderness. There is no guarding or rebound.  Musculoskeletal:        General: Normal range of motion.     Cervical back: Normal range of motion and neck supple.  Skin:    General: Skin is warm and dry.     Capillary Refill: Capillary refill takes less than 2 seconds.  Neurological:     General: No focal deficit present.     Mental Status: He is alert and oriented to person, place, and time.     Deep Tendon Reflexes: Reflexes normal.  Psychiatric:        Mood and Affect: Mood normal.        Behavior: Behavior normal.  ED Results / Procedures / Treatments   Labs (all labs ordered are listed, but only abnormal results are displayed) Results for orders placed or performed during the hospital encounter of 03/10/22  Resp Panel by RT-PCR (Flu A&B, Covid) Anterior Nasal Swab   Specimen: Anterior Nasal Swab  Result Value Ref Range   SARS Coronavirus 2 by RT PCR NEGATIVE NEGATIVE   Influenza A by PCR NEGATIVE NEGATIVE   Influenza B by PCR NEGATIVE NEGATIVE  CBC with Differential  Result Value Ref Range   WBC 7.1 4.0 - 10.5 K/uL   RBC 4.48 4.22 - 5.81 MIL/uL   Hemoglobin 14.2 13.0 - 17.0 g/dL   HCT 44.9 67.5 - 91.6 %   MCV 94.2 80.0 - 100.0 fL   MCH 31.7 26.0 - 34.0 pg   MCHC 33.6 30.0 - 36.0 g/dL   RDW 38.4 66.5 - 99.3 %   Platelets 344 150 - 400 K/uL   nRBC 0.0 0.0 - 0.2 %   Neutrophils Relative %  67 %   Neutro Abs 4.7 1.7 - 7.7 K/uL   Lymphocytes Relative 21 %   Lymphs Abs 1.5 0.7 - 4.0 K/uL   Monocytes Relative 9 %   Monocytes Absolute 0.6 0.1 - 1.0 K/uL   Eosinophils Relative 2 %   Eosinophils Absolute 0.2 0.0 - 0.5 K/uL   Basophils Relative 1 %   Basophils Absolute 0.1 0.0 - 0.1 K/uL   Immature Granulocytes 0 %   Abs Immature Granulocytes 0.03 0.00 - 0.07 K/uL  Basic metabolic panel  Result Value Ref Range   Sodium 140 135 - 145 mmol/L   Potassium 3.3 (L) 3.5 - 5.1 mmol/L   Chloride 107 98 - 111 mmol/L   CO2 24 22 - 32 mmol/L   Glucose, Bld 126 (H) 70 - 99 mg/dL   BUN 20 8 - 23 mg/dL   Creatinine, Ser 5.70 0.61 - 1.24 mg/dL   Calcium 8.5 (L) 8.9 - 10.3 mg/dL   GFR, Estimated >17 >79 mL/min   Anion gap 9 5 - 15  Brain natriuretic peptide  Result Value Ref Range   B Natriuretic Peptide 23.6 0.0 - 100.0 pg/mL  Troponin I (High Sensitivity)  Result Value Ref Range   Troponin I (High Sensitivity) 3 <18 ng/L   DG Chest Portable 1 View  Result Date: 03/10/2022 CLINICAL DATA:  Shortness of breath EXAM: PORTABLE CHEST 1 VIEW COMPARISON:  02/05/2021 FINDINGS: Cardiac shadow is within normal limits. Lungs are well aerated without focal infiltrate or effusion. No bony abnormality is noted. IMPRESSION: No acute abnormality seen. Electronically Signed   By: Alcide Clever M.D.   On: 03/10/2022 02:50     EKG EKG Interpretation  Date/Time:  Saturday March 10 2022 02:36:34 EST Ventricular Rate:  71 PR Interval:  130 QRS Duration: 89 QT Interval:  377 QTC Calculation: 410 R Axis:   82 Text Interpretation: Sinus rhythm Confirmed by Nicanor Alcon, Dallis Czaja ([id number]) on 03/10/2022 3:31:54 AM  Radiology DG Chest Portable 1 View  Result Date: 03/10/2022 CLINICAL DATA:  Shortness of breath EXAM: PORTABLE CHEST 1 VIEW COMPARISON:  02/05/2021 FINDINGS: Cardiac shadow is within normal limits. Lungs are well aerated without focal infiltrate or effusion. No bony abnormality is noted.  IMPRESSION: No acute abnormality seen. Electronically Signed   By: Alcide Clever M.D.   On: 03/10/2022 02:50    Procedures Procedures    Medications Ordered in ED Medications  cefTRIAXone (ROCEPHIN) 1 g in sodium chloride 0.9 % 100  mL IVPB (has no administration in time range)  methylPREDNISolone sodium succinate (SOLU-MEDROL) 125 mg/2 mL injection 125 mg (125 mg Intravenous Given 03/10/22 0310)  magnesium sulfate IVPB 2 g 50 mL (0 g Intravenous Stopped 03/10/22 0327)  albuterol (PROVENTIL) (2.5 MG/3ML) 0.083% nebulizer solution 5 mg (5 mg Nebulization Given 03/10/22 0313)    ED Course/ Medical Decision Making/ A&P                           Medical Decision Making Patient just quit smoking with wheezing and SOB  Amount and/or Complexity of Data Reviewed Independent Historian: EMS    Details: See above  External Data Reviewed: radiology, ECG and notes.    Details: Previous notes reviewed  Labs: ordered.    Details: All labs reviewed:  negative covid and flu.  Negative BNP 23.6, negative troponin 3.  Normal white count 7.1, normal hemoglobin 14.2, normal platelet count.  Normal sodium 140, slightly low potassium 3.3, normal creatinine .85 Radiology: ordered and independent interpretation performed.    Details: No PNA by me  ECG/medicine tests: ordered and independent interpretation performed. Decision-making details documented in ED Course.  Risk Prescription drug management. Decision regarding hospitalization. Risk Details: Patient is requiring O2 and is not O2 dependent and will need to come in for COPD exacerbation.      Final Clinical Impression(s) / ED Diagnoses Final diagnoses:  None   The patient appears reasonably stabilized for admission considering the current resources, flow, and capabilities available in the ED at this time, and I doubt any other [facility] requiring further screening and/or treatment in the ED prior to admission.  Rx / DC Orders ED Discharge Orders      None         Jaleeyah Munce, MD 03/10/22 [phone]

## 2022-03-10 NOTE — Assessment & Plan Note (Signed)
Replaced. °

## 2022-03-10 NOTE — Assessment & Plan Note (Addendum)
Slow clinical improvement Continuing doxycycline for total of 5 days Continuing Solu-Medrol Aggressive bronchodilator therapy Supplemental oxygen which we are attempting to wean as able with target oxygen saturations between 89 and 92%.

## 2022-03-10 NOTE — Progress Notes (Signed)
  Echocardiogram 2D Echocardiogram has been performed.  Milda Smart 03/10/2022, 11:58 AM

## 2022-03-10 NOTE — Assessment & Plan Note (Addendum)
Harsh systolic murmur in setting of respiratory failure likely secondary to COPD exacerbation; however, warrants echo Echo pending Bnp wnl  Denies any dizziness/lightheadedness

## 2022-03-10 NOTE — Assessment & Plan Note (Addendum)
He has cut down, encouraged him to continue to decrease and stop Nicotine patch

## 2022-03-10 NOTE — ED Notes (Addendum)
Palumbo MD made aware of O2 sats. 2L O2 applied via [place]. Pt does not wear O2 at home.

## 2022-03-10 NOTE — ED Triage Notes (Signed)
Pt BIB [facility]. Pt c/o cough that has been going on since midnight. Pt wheezing, Ems administered 10 mg albuterol. mg Atrovent. 125 mg solumedrol. Pt c/o of flank pain when coughing.   88HR 108/76 92% room air RR 24

## 2022-03-11 DIAGNOSIS — R011 Cardiac murmur, unspecified: Secondary | ICD-10-CM | POA: Diagnosis present

## 2022-03-11 DIAGNOSIS — E039 Hypothyroidism, unspecified: Secondary | ICD-10-CM | POA: Diagnosis not present

## 2022-03-11 DIAGNOSIS — J441 Chronic obstructive pulmonary disease with (acute) exacerbation: Secondary | ICD-10-CM | POA: Diagnosis not present

## 2022-03-11 DIAGNOSIS — F17210 Nicotine dependence, cigarettes, uncomplicated: Secondary | ICD-10-CM | POA: Diagnosis present

## 2022-03-11 DIAGNOSIS — Z79890 Hormone replacement therapy (postmenopausal): Secondary | ICD-10-CM | POA: Diagnosis not present

## 2022-03-11 DIAGNOSIS — J9601 Acute respiratory failure with hypoxia: Secondary | ICD-10-CM | POA: Diagnosis not present

## 2022-03-11 DIAGNOSIS — Z720 Tobacco use: Secondary | ICD-10-CM | POA: Diagnosis not present

## 2022-03-11 DIAGNOSIS — Z5900 Homelessness unspecified: Secondary | ICD-10-CM | POA: Diagnosis not present

## 2022-03-11 DIAGNOSIS — F1011 Alcohol abuse, in remission: Secondary | ICD-10-CM | POA: Diagnosis present

## 2022-03-11 DIAGNOSIS — E876 Hypokalemia: Secondary | ICD-10-CM | POA: Diagnosis not present

## 2022-03-11 DIAGNOSIS — Z1152 Encounter for screening for COVID-19: Secondary | ICD-10-CM | POA: Diagnosis not present

## 2022-03-11 LAB — CBC
HCT: 40.9 % (ref 39.0–52.0)
Hemoglobin: 13.9 g/dL (ref 13.0–17.0)
MCH: 32.1 pg (ref 26.0–34.0)
MCHC: 34 g/dL (ref 30.0–36.0)
MCV: 94.5 fL (ref 80.0–100.0)
Platelets: 375 10*3/uL (ref 150–400)
RBC: 4.33 MIL/uL (ref 4.22–5.81)
RDW: 13 % (ref 11.5–15.5)
WBC: 14.4 10*3/uL — ABNORMAL HIGH (ref 4.0–10.5)
nRBC: 0 % (ref 0.0–0.2)

## 2022-03-11 LAB — BASIC METABOLIC PANEL
Anion gap: 6 (ref 5–15)
BUN: 24 mg/dL — ABNORMAL HIGH (ref 8–23)
CO2: 23 mmol/L (ref 22–32)
Calcium: 8.7 mg/dL — ABNORMAL LOW (ref 8.9–10.3)
Chloride: 108 mmol/L (ref 98–111)
Creatinine, Ser: 0.76 mg/dL (ref 0.61–1.24)
GFR, Estimated: >60 mL/min (ref >60)
Glucose, Bld: 97 mg/dL (ref 70–99)
Potassium: 4.2 mmol/L (ref 3.5–5.1)
Sodium: 137 mmol/L (ref 135–145)

## 2022-03-11 LAB — T4, FREE: Free T4: 0.41 ng/dL — ABNORMAL LOW (ref 0.61–1.12)

## 2022-03-11 MED ORDER — HYDROCOD POLI-CHLORPHE POLI ER 10-8 MG/5ML PO SUER
5.0000 mL | Freq: Two times a day (BID) | ORAL | Status: DC
  Administered 2022-03-11 – 2022-03-15 (×9): 5 mL via ORAL
  Filled 2022-03-11 (×9): qty 5

## 2022-03-11 MED ORDER — METHYLPREDNISOLONE SODIUM SUCC 40 MG IJ SOLR
40.0000 mg | Freq: Two times a day (BID) | INTRAMUSCULAR | Status: DC
  Administered 2022-03-12 – 2022-03-15 (×7): 40 mg via INTRAVENOUS
  Filled 2022-03-11 (×7): qty 1

## 2022-03-11 MED ORDER — DOXYCYCLINE HYCLATE 100 MG PO TABS
100.0000 mg | ORAL_TABLET | Freq: Two times a day (BID) | ORAL | Status: DC
  Administered 2022-03-12 – 2022-03-15 (×7): 100 mg via ORAL
  Filled 2022-03-11 (×7): qty 1

## 2022-03-11 MED ORDER — GUAIFENESIN-DM 100-10 MG/5ML PO SYRP
5.0000 mL | ORAL_SOLUTION | ORAL | Status: DC | PRN
  Administered 2022-03-11 – 2022-03-14 (×9): 5 mL via ORAL
  Filled 2022-03-11 (×9): qty 10

## 2022-03-11 NOTE — Progress Notes (Signed)
Bipap placed on standby in pts room. Pt does not need it at this time. He is speaking in full sentences.Spo2 98 heart rate 74. Breathing does not appear to be labored. Spoke with pts RN. RN will call if pt has any change in breathing.

## 2022-03-11 NOTE — Assessment & Plan Note (Signed)
TOC consult placed for assistance with outpatient PCP and possible medication assistance Will attempt to get patient new PCP at time of discharge.

## 2022-03-11 NOTE — Hospital Course (Signed)
Mr. Jimmy [name] is a 66 yo male with PMH homelessness, COPD, ongoing tobacco use (but trying to wean some), substance use who presented to the ER with worsening shortness of breath, cough, and wheezing. 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 He continues to smoke but states he has been trying to cut back recently and is now going through approximately 1 pack/week, down from half a pack a day. CXR on admission was negative for infiltrates or volume overload. RVP panel along with COVID and flu swabs were negative.  He was considered to have COPD exacerbation and was started on antibiotics, breathing treatments, and steroids.

## 2022-03-11 NOTE — Progress Notes (Signed)
Progress Note    Jimmy [name]   [id number]:[id number]  DOB: 11-02-1955  DOA: 03/10/2022     0 PCP: Pcp, No  Initial CC: SOB, cough  Hospital Course: Mr. Jimmy [name] is a 66 yo male with PMH homelessness, COPD, ongoing tobacco use (but trying to wean some), substance use who presented to the ER with worsening shortness of breath, cough, and wheezing. 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 He continues to smoke but states he has been trying to cut back recently and is now going through approximately 1 pack/week, down from half a pack a day. CXR on admission was negative for infiltrates or volume overload. RVP panel along with COVID and flu swabs were negative.  He was considered to have COPD exacerbation and was started on antibiotics, breathing treatments, and steroids.  Interval History:  Seen this morning in his room having a significant spell of coughing severely and having a hard time catching his breath.  He was able to calm down some.  He did get more short of breath later in the morning and respiratory was asked to evaluate; currently no need for BiPAP at this time.  Assessment and Plan: * COPD with acute exacerbation (HCC) - Significant dyspnea/SOB, increased cough and sputum production - Agree with continuing antibiotics; change Rocephin to doxycycline - Negative COVID, flu, RVP - Continue Solu-Medrol - Continue breathing treatments - Start scheduled Tussionex and continue Mucinex - May still require BiPAP if worsening work of breathing/dyspnea  Acute respiratory failure with hypoxia (HCC) - Not on oxygen outpatient - Initially needed 6 L on admission and has been slowly weaned - Continue weaning as able - echo reassuring and normal  Homelessness - TOC consult placed for assistance with outpatient PCP and possible medication assistance; should be a good candidate for [facility] however transportation is also his barrier  Hypokalemia - Replete as needed  Tobacco abuse He has cut down, encouraged him to continue to decrease and stop Nicotine patch   Systolic murmur - Echo obtained on admission.  EF 65 to 70%, no RWMA.  Normal diastolic parameters.  No significant valvular abnormalities either.   Old records reviewed in assessment of this patient  Antimicrobials: Rocephin 12/9 >> 12/10 Doxycycline 03/11/2022 >> current   DVT prophylaxis:  enoxaparin (LOVENOX) injection 40 mg Start: 03/10/22 1200   Code Status:   Code Status: Full Code  Mobility Assessment (last 72 hours)     Mobility Assessment     Row Name 03/10/22 1915 03/10/22 1731         Does patient have an order for bedrest or is patient medically unstable No - Continue assessment No - Continue assessment      What is the highest level of mobility based on the progressive mobility assessment? Level 6 (Walks independently in room and hall) - Balance while walking in room without assist - Complete Level 6 (Walks independently in room and hall) - Balance while walking in room without assist - Complete               Barriers to discharge:  Disposition Plan: Home/discharge in 2 to 3 days Status is: Observation  Objective: Blood pressure (!) 142/79, pulse 72, temperature 97.8 F (36.6 C), temperature source Oral, resp. rate (!) 21, height 5\' 1"  (1.549 m), weight 53.5 kg, SpO2 92 %.  Examination:  Physical Exam Constitutional:  Comments: Thin appearing adult man lying in bed appearing uncomfortable and in mild distress from coughing  HENT:     Head: Normocephalic and atraumatic.     Mouth/Throat:     Mouth: Mucous membranes are moist.  Eyes:     Extraocular Movements: Extraocular movements intact.  Cardiovascular:     Rate and Rhythm: Normal rate and regular rhythm.  Pulmonary:     Comments: Diffuse coarse breath sounds bilaterally Chest:     Comments: Mild barrel  chest appreciated from COPD Abdominal:     General: Bowel sounds are normal. There is no distension.     Palpations: Abdomen is soft.     Tenderness: There is no abdominal tenderness.     Comments: Thin abdomen  Musculoskeletal:        General: No swelling. Normal range of motion.     Cervical back: Normal range of motion and neck supple.  Skin:    General: Skin is warm and dry.  Neurological:     General: No focal deficit present.  Psychiatric:        Mood and Affect: Mood normal.        Behavior: Behavior normal.      Consultants:    Procedures:    Data Reviewed: Results for orders placed or performed during the hospital encounter of 03/10/22 (from the past 24 hour(s))  Respiratory (~20 pathogens) panel by PCR     Status: None   Collection Time: 03/10/22 12:38 PM   Specimen: Nasopharyngeal Swab; Respiratory  Result Value Ref Range   Adenovirus NOT DETECTED NOT DETECTED   Coronavirus 229E NOT DETECTED NOT DETECTED   Coronavirus HKU1 NOT DETECTED NOT DETECTED   Coronavirus NL63 NOT DETECTED NOT DETECTED   Coronavirus OC43 NOT DETECTED NOT DETECTED   Metapneumovirus NOT DETECTED NOT DETECTED   Rhinovirus / Enterovirus NOT DETECTED NOT DETECTED   Influenza A NOT DETECTED NOT DETECTED   Influenza B NOT DETECTED NOT DETECTED   Parainfluenza Virus 1 NOT DETECTED NOT DETECTED   Parainfluenza Virus 2 NOT DETECTED NOT DETECTED   Parainfluenza Virus 3 NOT DETECTED NOT DETECTED   Parainfluenza Virus 4 NOT DETECTED NOT DETECTED   Respiratory Syncytial Virus NOT DETECTED NOT DETECTED   Bordetella pertussis NOT DETECTED NOT DETECTED   Bordetella Parapertussis NOT DETECTED NOT DETECTED   Chlamydophila pneumoniae NOT DETECTED NOT DETECTED   Mycoplasma pneumoniae NOT DETECTED NOT DETECTED  Expectorated Sputum Assessment w Gram Stain, Rflx to Resp Cult     Status: None   Collection Time: 03/10/22 12:38 PM   Specimen: Sputum  Result Value Ref Range   Specimen Description SPUTUM     Special Requests NONE    Sputum evaluation      THIS SPECIMEN IS ACCEPTABLE FOR SPUTUM CULTURE Performed at [facility], [address], [place], [place] [address]    Report Status 03/10/2022 FINAL   Rapid urine drug screen (hospital performed)     Status: Abnormal   Collection Time: 03/10/22 12:38 PM  Result Value Ref Range   Opiates NONE DETECTED NONE DETECTED   Cocaine POSITIVE (A) NONE DETECTED   Benzodiazepines NONE DETECTED NONE DETECTED   Amphetamines NONE DETECTED NONE DETECTED   Tetrahydrocannabinol NONE DETECTED NONE DETECTED   Barbiturates NONE DETECTED NONE DETECTED  Culture, Respiratory w Gram Stain     Status: None (Preliminary result)   Collection Time: 03/10/22 12:38 PM   Specimen: SPU  Result Value Ref Range   Specimen Description  SPUTUM Performed at [facility], [address], [place], [place] [address]    Special Requests      NONE Reflexed from [id number] Performed at [facility], [address], [place], [place] [address]    Gram Stain      FEW GRAM POSITIVE COCCI IN PAIRS IN CLUSTERS RARE GRAM NEGATIVE RODS RARE YEAST RARE WBC PRESENT, PREDOMINANTLY PMN RARE SQUAMOUS EPITHELIAL CELLS PRESENT    Culture      TOO YOUNG TO READ Performed at [facility], [address] [place], [place] [address]    Report Status PENDING   Basic metabolic panel     Status: Abnormal   Collection Time: 03/11/22  4:48 AM  Result Value Ref Range   Sodium 137 135 - 145 mmol/L   Potassium 4.2 3.5 - 5.1 mmol/L   Chloride 108 98 - 111 mmol/L   CO2 23 22 - 32 mmol/L   Glucose, Bld 97 70 - 99 mg/dL   BUN 24 (H) 8 - 23 mg/dL   Creatinine, Ser 8.12 0.61 - 1.24 mg/dL   Calcium 8.7 (L) 8.9 - 10.3 mg/dL   GFR, Estimated >75 >17 mL/min   Anion gap 6 5 - 15  CBC     Status: Abnormal   Collection Time: 03/11/22  4:48 AM  Result Value Ref Range   WBC 14.4 (H) 4.0 - 10.5 K/uL   RBC 4.33 4.22 - 5.81 MIL/uL    Hemoglobin 13.9 13.0 - 17.0 g/dL   HCT 00.1 74.9 - 44.9 %   MCV 94.5 80.0 - 100.0 fL   MCH 32.1 26.0 - 34.0 pg   MCHC 34.0 30.0 - 36.0 g/dL   RDW 67.5 91.6 - 38.4 %   Platelets 375 150 - 400 K/uL   nRBC 0.0 0.0 - 0.2 %    I have Reviewed nursing notes, Vitals, and Lab results since pt's last encounter. Pertinent lab results : see above I have ordered test including BMP, CBC, Mg I have reviewed the last note from staff over past 24 hours I have discussed pt's care plan and test results with nursing staff, case manager  Time spent: Greater than 50% of the 55 minute visit was spent in counseling/coordination of care for the patient as laid out in the A&P.    LOS: 0 days   Lewie Chamber, MD [facility] 03/11/2022, 11:55 AM

## 2022-03-12 DIAGNOSIS — E039 Hypothyroidism, unspecified: Secondary | ICD-10-CM | POA: Diagnosis present

## 2022-03-12 LAB — T4, FREE: Free T4: 0.54 ng/dL — ABNORMAL LOW (ref 0.61–1.12)

## 2022-03-12 LAB — TSH: TSH: 43.724 u[IU]/mL — ABNORMAL HIGH (ref 0.350–4.500)

## 2022-03-12 LAB — HEMOGLOBIN A1C
Hgb A1c MFr Bld: 5.8 % — ABNORMAL HIGH (ref 4.8–5.6)
Mean Plasma Glucose: 120 mg/dL

## 2022-03-12 MED ORDER — LEVOTHYROXINE SODIUM 75 MCG PO TABS
75.0000 ug | ORAL_TABLET | Freq: Every day | ORAL | Status: DC
  Administered 2022-03-12 – 2022-03-15 (×4): 75 ug via ORAL
  Filled 2022-03-12 (×4): qty 1

## 2022-03-12 NOTE — Progress Notes (Signed)
Progress Note    Jimmy [name]   YKD:[id number]  DOB: November 01, 1955  DOA: 03/10/2022     1 PCP: Pcp, No  Initial CC: SOB, cough  Hospital Course: Mr. Saye is a 66 yo male with PMH homelessness, COPD, ongoing tobacco use (but trying to wean some), substance use who presented to the ER with worsening shortness of breath, cough, and wheezing. 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 He continues to smoke but states he has been trying to cut back recently and is now going through approximately 1 pack/week, down from half a pack a day. CXR on admission was negative for infiltrates or volume overload. RVP panel along with COVID and flu swabs were negative.  He was considered to have COPD exacerbation and was started on antibiotics, breathing treatments, and steroids.  Interval History:  No events overnight.  Still coughing but slightly improved and he is less short of breath and dyspneic today. Reviewed his thyroid studies with him and he understands need for Synthroid.  Assessment and Plan: * COPD with acute exacerbation (HCC) - Significant dyspnea/SOB, increased cough and sputum production - Agree with continuing antibiotics; change Rocephin to doxycycline - Negative COVID, flu, RVP - Continue Solu-Medrol - Continue breathing treatments - Start scheduled Tussionex and continue Mucinex - May still require BiPAP if worsening work of breathing/dyspnea  Acute respiratory failure with hypoxia (HCC) - Not on oxygen outpatient - Initially needed 6 L on admission and has been slowly weaned - Continue weaning as able - echo reassuring and normal  Hypothyroidism - No prior known diagnosis prior to admission - TSH elevated and free T4 low.  Labs repeated again for confirmation and do confirm hypothyroidism - Patient started on Synthroid -Will need repeat thyroid studies in 4 to 6 weeks for any  further adjustments  Homelessness - TOC consult placed for assistance with outpatient PCP and possible medication assistance; should be a good candidate for [facility] however transportation is also his barrier  Hypokalemia - Replete as needed  Tobacco abuse He has cut down, encouraged him to continue to decrease and stop Nicotine patch   Systolic murmur - Echo obtained on admission.  EF 65 to 70%, no RWMA.  Normal diastolic parameters.  No significant valvular abnormalities either.   Old records reviewed in assessment of this patient  Antimicrobials: Rocephin 12/9 >> 12/10 Doxycycline 03/11/2022 >> current   DVT prophylaxis:  enoxaparin (LOVENOX) injection 40 mg Start: 03/10/22 1200   Code Status:   Code Status: Full Code  Mobility Assessment (last 72 hours)     Mobility Assessment     Row Name 03/11/22 1930 03/10/22 1915 03/10/22 1731       Does patient have an order for bedrest or is patient medically unstable No - Continue assessment No - Continue assessment No - Continue assessment     What is the highest level of mobility based on the progressive mobility assessment? Level 6 (Walks independently in room and hall) - Balance while walking in room without assist - Complete Level 6 (Walks independently in room and hall) - Balance while walking in room without assist - Complete Level 6 (Walks independently in room and hall) - Balance while walking in room without assist - Complete              Barriers to discharge:  Disposition Plan: Home/discharge in 2 to 3 days Status  is: Observation  Objective: Blood pressure 124/76, pulse 97, temperature 97.9 F (36.6 C), temperature source Oral, resp. rate (!) 23, height 5\' 1"  (1.549 m), weight 53.5 kg, SpO2 94 %.  Examination:  Physical Exam Constitutional:      Comments: Thin appearing adult man lying in bed appearing more comfortable but still coughing  HENT:     Head: Normocephalic and atraumatic.      Mouth/Throat:     Mouth: Mucous membranes are moist.  Eyes:     Extraocular Movements: Extraocular movements intact.  Cardiovascular:     Rate and Rhythm: Normal rate and regular rhythm.  Pulmonary:     Comments: Diffuse coarse breath sounds bilaterally Chest:     Comments: Mild barrel chest appreciated from COPD Abdominal:     General: Bowel sounds are normal. There is no distension.     Palpations: Abdomen is soft.     Tenderness: There is no abdominal tenderness.     Comments: Thin abdomen  Musculoskeletal:        General: No swelling. Normal range of motion.     Cervical back: Normal range of motion and neck supple.  Skin:    General: Skin is warm and dry.  Neurological:     General: No focal deficit present.  Psychiatric:        Mood and Affect: Mood normal.        Behavior: Behavior normal.      Consultants:    Procedures:    Data Reviewed: Results for orders placed or performed during the hospital encounter of 03/10/22 (from the past 24 hour(s))  TSH     Status: Abnormal   Collection Time: 03/12/22 11:15 AM  Result Value Ref Range   TSH 43.724 (H) 0.350 - 4.500 uIU/mL  T4, free     Status: Abnormal   Collection Time: 03/12/22 11:15 AM  Result Value Ref Range   Free T4 0.54 (L) 0.61 - 1.12 ng/dL    I have Reviewed nursing notes, Vitals, and Lab results since pt's last encounter. Pertinent lab results : see above I have ordered test including BMP, CBC, Mg I have reviewed the last note from staff over past 24 hours I have discussed pt's care plan and test results with nursing staff, case manager  Time spent: Greater than 50% of the 55 minute visit was spent in counseling/coordination of care for the patient as laid out in the A&P.    LOS: 1 day   14/11/23, MD [facility] 03/12/2022, 4:09 PM

## 2022-03-12 NOTE — Assessment & Plan Note (Addendum)
New diagnosis during this hospitalization TSH elevated, free T4 low Initiated on Synthroid 75 mcg daily

## 2022-03-13 LAB — CULTURE, RESPIRATORY W GRAM STAIN: Culture: NORMAL

## 2022-03-13 NOTE — Progress Notes (Signed)
Mobility Specialist - Progress Note   03/13/22 1135  Oxygen Therapy  O2 Device Nasal Cannula  O2 Flow Rate (L/min) 2 L/min  Mobility  Activity Ambulated independently in hallway  Level of Assistance Modified independent, requires aide device or extra time  Distance Ambulated (ft) 60 ft  Activity Response Tolerated well  Mobility Referral Yes  $Mobility charge 1 Mobility   Nurse requested Mobility Specialist to perform oxygen saturation test with pt which includes removing pt from oxygen both at rest and while ambulating.  Below are the results from that testing.     Patient Saturations on Room Air at Rest = spO2 91% (but very SOB) Patient Saturations on Room Air while Ambulating = sp02 94% .  Rested and performed pursed lip breathing for 1 minute with sp02 at 94%. Patient Saturations on 2 Liters of oxygen while Ambulating = sp02 93%  At end of testing pt left in room on 2  Liters of oxygen.  Reported results to nurse.     Pt received in bed and agreeable to mobility. Pt very SOB throughout entire session. Required one standing rest break to catch breath, encouraged pursed lip breathing. During ambulation, pt stated feeling woozy & opted out to take break in hallway chair. Pt still felt woozy after a short break (~32mn). Pt wheeled back in recliner due to SOB & feeling weak. No other complaints during mobility. Pt to bed after session with all needs met & NT in room.   Pre-mobility: 91bpm HR During mobility: 121bpm HR Post-mobility: 115bpm HR  MSet designer

## 2022-03-13 NOTE — Progress Notes (Signed)
Progress Note    Jimmy [name]   HWT:[id number]  DOB: 11/29/1955  DOA: 03/10/2022     2 PCP: Pcp, No  Initial CC: SOB, cough  Hospital Course: Jimmy [name] is a 66 yo male with PMH homelessness, COPD, ongoing tobacco use (but trying to wean some), substance use who presented to the ER with worsening shortness of breath, cough, and wheezing. 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 He continues to smoke but states he has been trying to cut back recently and is now going through approximately 1 pack/week, down from half a pack a day. CXR on admission was negative for infiltrates or volume overload. RVP panel along with COVID and flu swabs were negative.  He was considered to have COPD exacerbation and was started on antibiotics, breathing treatments, and steroids.  Interval History:  No events overnight.  Continues to cough but some improvement today.  Less dyspneic as well.  Also able to ambulate some with mobility. He is asking how he will be able to obtain and afford Synthroid.  Assessment and Plan: * COPD with acute exacerbation (HCC) - Significant dyspnea/SOB, increased cough and sputum production - Agree with continuing antibiotics; change Rocephin to doxycycline - Negative COVID, flu, RVP - Continue Solu-Medrol - Continue breathing treatments - Start scheduled Tussionex and continue Mucinex - May still require BiPAP if worsening work of breathing/dyspnea  Acute respiratory failure with hypoxia (HCC) - Not on oxygen outpatient - Initially needed 6 L on admission and has been slowly weaned - Continue weaning as able - echo reassuring and normal - goal is to wean off O2 prior to discharge given poor social situation   Hypothyroidism - No prior known diagnosis prior to admission - TSH elevated and free T4 low.  Labs repeated again for confirmation and do confirm hypothyroidism -  Patient started on Synthroid. Will need financial assistance. -Will need repeat thyroid studies in 4 to 6 weeks for any further adjustments  Homelessness - TOC consult placed for assistance with outpatient PCP and possible medication assistance; should be a good candidate for [facility] however transportation is also his barrier  Hypokalemia - Replete as needed  Tobacco abuse He has cut down, encouraged him to continue to decrease and stop Nicotine patch   Systolic murmur - Echo obtained on admission.  EF 65 to 70%, no RWMA.  Normal diastolic parameters.  No significant valvular abnormalities either.   Old records reviewed in assessment of this patient  Antimicrobials: Rocephin 12/9 >> 12/10 Doxycycline 03/11/2022 >> current   DVT prophylaxis:  enoxaparin (LOVENOX) injection 40 mg Start: 03/10/22 1200   Code Status:   Code Status: Full Code  Mobility Assessment (last 72 hours)     Mobility Assessment     Row Name 03/11/22 1930 03/10/22 1915 03/10/22 1731       Does patient have an order for bedrest or is patient medically unstable No - Continue assessment No - Continue assessment No - Continue assessment     What is the highest level of mobility based on the progressive mobility assessment? Level 6 (Walks independently in room and hall) - Balance while walking in room without assist - Complete Level 6 (Walks independently in room and hall) - Balance while walking in room without assist - Complete Level 6 (Walks independently in room and hall) - Balance while walking in room without assist - Complete  Barriers to discharge:  Disposition Plan: Home/discharge in 2 to 3 days Status is: Observation  Objective: Blood pressure 123/75, pulse 84, temperature 97.9 F (36.6 C), resp. rate 20, height 5\' 1"  (1.549 m), weight 53.5 kg, SpO2 93 %.  Examination:  Physical Exam Constitutional:      Comments: Thin appearing adult man lying in bed  appearing more comfortable but still coughing  HENT:     Head: Normocephalic and atraumatic.     Mouth/Throat:     Mouth: Mucous membranes are moist.  Eyes:     Extraocular Movements: Extraocular movements intact.  Cardiovascular:     Rate and Rhythm: Normal rate and regular rhythm.  Pulmonary:     Comments: Diffuse coarse breath sounds bilaterally; some small improvement today Chest:     Comments: Mild barrel chest appreciated from COPD Abdominal:     General: Bowel sounds are normal. There is no distension.     Palpations: Abdomen is soft.     Tenderness: There is no abdominal tenderness.     Comments: Thin abdomen  Musculoskeletal:        General: No swelling. Normal range of motion.     Cervical back: Normal range of motion and neck supple.  Skin:    General: Skin is warm and dry.  Neurological:     General: No focal deficit present.  Psychiatric:        Mood and Affect: Mood normal.        Behavior: Behavior normal.      Consultants:    Procedures:    Data Reviewed: No results found for this or any previous visit (from the past 24 hour(s)).   I have Reviewed nursing notes, Vitals, and Lab results since pt's last encounter. Pertinent lab results : see above I have reviewed the last note from staff over past 24 hours I have discussed pt's care plan and test results with nursing staff, case manager  Time spent: Greater than 50% of the 55 minute visit was spent in counseling/coordination of care for the patient as laid out in the A&P.    LOS: 2 days   , MD [facility] 03/13/2022, 3:01 PM

## 2022-03-13 NOTE — TOC Initial Note (Signed)
Transition of Care [facility]) - Initial/Assessment Note    Patient Details  Name: Marlowe Lawes MRN: [id number] Date of Birth: September 23, 1955  Transition [facility] Care [facility]) CM/SW Contact:    Golda Acre, RN Phone Number: 03/13/2022, 7:59 AM  Clinical Narrative:                 Smoking cessation information added to the dc instructions.  Expected Discharge Plan: Home/Self Care Barriers to Discharge: Continued Medical Work up   Patient Goals and CMS Choice Patient states their goals for this hospitalization and ongoing recovery are:: to get better CMS Medicare.gov Compare Post Acute Care list provided to:: Patient Choice offered to / list presented to : Patient  Expected Discharge Plan and Services Expected Discharge Plan: Home/Self Care   Discharge Planning Services: CM Consult                                          Prior Living Arrangements/Services   Lives with:: Self Patient language and need for interpreter reviewed:: Yes Do you feel safe going back to the place where you live?: Yes               Activities of Daily Living Home Assistive Devices/Equipment: None ADL Screening (condition at time of admission) Patient's cognitive ability adequate to safely complete daily activities?: Yes Is the patient deaf or have difficulty hearing?: No Does the patient have difficulty seeing, even when wearing glasses/contacts?: No Does the patient have difficulty concentrating, remembering, or making decisions?: No Patient able to express need for assistance with ADLs?: No Does the patient have difficulty dressing or bathing?: No Independently performs ADLs?: Yes (appropriate for developmental age) Does the patient have difficulty walking or climbing stairs?: No Weakness of Legs: None Weakness of Arms/Hands: None  Permission Sought/Granted                  Emotional Assessment Appearance:: Appears stated age Attitude/Demeanor/Rapport: Engaged Affect  (typically observed): Calm Orientation: : Oriented to Self, Oriented to Place, Oriented to  Time, Oriented to Situation Alcohol / Substance Use: Tobacco Use Psych Involvement: No (comment)  Admission diagnosis:  COPD exacerbation (HCC) [J44.1] COPD with acute exacerbation (HCC) [J44.1] Patient Active Problem List   Diagnosis Date Noted   Hypothyroidism 03/12/2022   Homelessness 03/11/2022   COPD with acute exacerbation (HCC) 03/10/2022   Hypokalemia 03/10/2022   Tobacco abuse 03/10/2022   Acute respiratory failure with hypoxia (HCC) 03/10/2022   Systolic murmur 03/10/2022   Influenza A 02/05/2021   Acute bronchitis 02/05/2021   Bronchitis 02/05/2021   PCP:  Pcp, No Pharmacy:   [id number] [place], [place] - [address] [place] [address] Phone: [phone] Fax: [phone]  [facility] [address] [place] [address] Phone: [phone] Fax: [phone]     Social Determinants of Health (SDOH) Interventions    Readmission Risk Interventions   No data to display

## 2022-03-13 NOTE — Progress Notes (Signed)
Mobility Specialist - Progress Note   03/13/22 1438  Oxygen Therapy  O2 Device Nasal Cannula  O2 Flow Rate (L/min) 2 L/min  Mobility  Activity Ambulated independently in hallway  Level of Assistance Independent  Assistive Device None  Distance Ambulated (ft) 240 ft  Activity Response Tolerated well  Mobility Referral Yes  $Mobility charge 1 Mobility   Pt received in bed and agreeable to mobility. Pt took one seated rest break (~46mn). Pt very SOB during session. Pt to bed after session with all needs met & respiratory therapist in room.   Pre-mobility: 93bpm HR, 92% SpO2 During mobility: 100bpm  HR, 96% SpO2 Post-mobility: 119bpm HR, 93% SPO2  MBear StearnsMobility Specialist    MB[facility] CenterMobility Specialist

## 2022-03-14 ENCOUNTER — Inpatient Hospital Stay (HOSPITAL_COMMUNITY): Payer: Medicaid Other

## 2022-03-14 DIAGNOSIS — Z720 Tobacco use: Secondary | ICD-10-CM

## 2022-03-14 DIAGNOSIS — E876 Hypokalemia: Secondary | ICD-10-CM

## 2022-03-14 NOTE — Progress Notes (Signed)
PROGRESS NOTE   Jimmy [name]  HMC:[id number] DOB: 06-27-1955 DOA: 03/10/2022 PCP: Pcp, No   Date of Service: the patient was seen and examined on 03/14/2022  Brief Narrative:  Jimmy [name] is a 66 yo male with PMH homelessness, COPD, ongoing tobacco use (but trying to wean some), substance use who presented to the ER with worsening shortness of breath, cough, and wheezing. 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 He continues to smoke but states he has been trying to cut back recently and is now going through approximately 1 pack/week, down from half a pack a day. CXR on admission was negative for infiltrates or volume overload. RVP panel along with COVID and flu swabs were negative.  He was considered to have COPD exacerbation and was started on antibiotics, breathing treatments, and steroids.   Assessment and Plan:  Hospital Problem List as of 03/14/2022          Priority Resolved POA     1.     * (Principal) COPD with acute exacerbation (HCC) 1.  Yes    Current Assessment & Plan 03/10/2022 Hospital Encounter Edited 03/14/2022  6:42 PM by Marinda Elk, MD     Slow clinical improvement Continuing doxycycline for total of 5 days Continuing Solu-Medrol Aggressive bronchodilator therapy Supplemental oxygen which we are attempting to wean as able with target oxygen saturations between 89 and 92%.        2.     Acute respiratory failure with hypoxia (HCC) 2.  Yes    Current Assessment & Plan 03/10/2022 Hospital Encounter Edited 03/14/2022  6:43 PM by Marinda Elk, MD      Initially needed 6 L on admission and has been slowly weaned  Continue weaning as able Remainder of assessment and plan as above         3.     Hypothyroidism 3.  Yes    Current Assessment & Plan 03/10/2022 Hospital Encounter Edited 03/14/2022  6:44 PM by Marinda Elk, MD     New diagnosis during this  hospitalization TSH elevated, free T4 low Initiated on Synthroid 75 mcg daily         4.     Hypokalemia 4.  Unknown    Current Assessment & Plan 03/10/2022 Hospital Encounter Edited 03/14/2022  6:45 PM by Marinda Elk, MD     Replaced        Homelessness 4.  Not Applicable    Current Assessment & Plan 03/10/2022 Hospital Encounter Edited 03/14/2022  6:46 PM by Marinda Elk, MD      [facility] consult placed for assistance with outpatient PCP and possible medication assistance Will attempt to get patient new PCP at time of discharge.          5.     Tobacco abuse 5.  Yes    Current Assessment & Plan 03/10/2022 Hospital Encounter Edited 03/14/2022  6:46 PM by Marinda Elk, MD     Counseling assess daily  Providing patient with nicotine replacement therapy with nicotine patches         7.     Systolic murmur 7.  Yes    Current Assessment & Plan 03/10/2022 Hospital Encounter Edited 03/11/2022 11:52 AM by Lewie Chamber, MD     - Echo obtained on admission.  EF 65 to 70%, no RWMA.  Normal diastolic parameters.  No significant valvular  abnormalities either.        Subjective:  Patient continuing to complain of shortness of breath.  Patient states that shortness of breath is moderate in severity and is somewhat improved compared to yesterday.  Shortness of breath is worse with exertion and improved with rest.  Patient denies any associated chest pain.  Physical Exam:  Vitals:   03/14/22 0909 03/14/22 1201 03/14/22 1247 03/14/22 1842  BP:   (!) 136/92   Pulse:   99   Resp:   (!) 22   Temp:   97.7 F (36.5 C)   TempSrc:   Oral   SpO2: 96% 95% 95% 92%  Weight:      Height:        Constitutional: Awake alert and oriented x3, no associated distress.   Skin: no rashes, no lesions, good skin turgor noted. Eyes: Pupils are equally reactive to light.  No evidence of scleral icterus or conjunctival pallor.  ENMT: Moist mucous membranes noted.  Posterior pharynx clear  of any exudate or lesions.   Respiratory: Minimal to no wheezing noted.  Increased respiratory effort.  No evidence of rales.  No accessory muscle use.  Cardiovascular: Regular rate and rhythm, no murmurs / rubs / gallops. No extremity edema. 2+ pedal pulses. No carotid bruits.  Abdomen: Abdomen is soft and nontender.  No evidence of intra-abdominal masses.  Positive bowel sounds noted in all quadrants.   Musculoskeletal: No joint deformity upper and lower extremities. Good ROM, no contractures. Normal muscle tone.    Data Reviewed:  I have personally reviewed and interpreted labs, imaging.  Significant findings are   CBC: Recent Labs  Lab 03/10/22 0314 03/11/22 0448  WBC 7.1 14.4*  NEUTROABS 4.7  --   HGB 14.2 13.9  HCT 42.2 40.9  MCV 94.2 94.5  PLT 344 375   Basic Metabolic Panel: Recent Labs  Lab 03/10/22 0314 03/10/22 0742 03/11/22 0448  NA 140  --  137  K 3.3*  --  4.2  CL 107  --  108  CO2 24  --  23  GLUCOSE 126*  --  97  BUN 20  --  24*  CREATININE 0.85  --  0.76  CALCIUM 8.5*  --  8.7*  MG  --  2.2  --    GFR: Estimated Creatinine Clearance: 67.2 mL/min (by C-G formula based on SCr of 0.76 mg/dL). Liver Function Tests: Recent Labs  Lab 03/10/22 0742  AST 28  ALT 22  ALKPHOS 53  BILITOT 0.3  PROT 6.8  ALBUMIN 3.3*    Coagulation Profile: No results for input(s): "INR", "PROTIME" in the last 168 hours.   Code Status:  Full code.  Code status decision has been confirmed with: patient    Severity of Illness:  The appropriate patient status for this patient is INPATIENT. Inpatient status is judged to be reasonable and necessary in order to provide the required intensity of service to ensure the patient's safety. The patient's presenting symptoms, physical exam findings, and initial radiographic and laboratory data in the context of their chronic comorbidities is felt to place them at high risk for further clinical deterioration. Furthermore, it is  not anticipated that the patient will be medically stable for discharge from the hospital within 2 midnights of admission.   * I certify that at the point of admission it is my clinical judgment that the patient will require inpatient hospital care spanning beyond 2 midnights from the point of admission due to  high intensity of service, high risk for further deterioration and high frequency of surveillance required.*  Time spent:  42 minutes  Author:  Marinda Elk MD  03/14/2022 6:47 PM

## 2022-03-14 NOTE — TOC Progression Note (Addendum)
Transition of Care [facility]) - Progression Note    Patient Details  Name: Jimmy [name] MRN: [id number] Date of Birth: [birth year]  Transition of Care [facility]) CM/SW Contact  Golda Acre, RN Phone Number: 03/14/2022, 8:16 AM  Clinical Narrative:    Resources for smoking cessation, shelters and transportation added to the dc instructions.   Expected Discharge Plan: Home/Self Care Barriers to Discharge: Continued Medical Work up  Expected Discharge Plan and Services Expected Discharge Plan: Home/Self Care   Discharge Planning Services: CM Consult                                           Social Determinants of Health (SDOH) Interventions    Readmission Risk Interventions   No data to display

## 2022-03-14 NOTE — Progress Notes (Signed)
Mobility Specialist - Progress Note   03/14/22 1105  Oxygen Therapy  O2 Device Nasal Cannula  O2 Flow Rate (L/min) 2 L/min  Mobility  Activity Ambulated independently in hallway  Level of Assistance Independent  Assistive Device None  Distance Ambulated (ft) 150 ft  Activity Response Tolerated well  Mobility Referral Yes  $Mobility charge 1 Mobility   Pt received in bed and agreeable to mobility. Pt SOB throughout ambulation. Pt required a seated rest breal (~61mn). No complaints during session. Pt to bed after session with all needs met.    Pre-mobility: 109bpm HR, 93% SpO2 During mobility: 113bpm HR, 100% SpO2 Post-mobility: 108bpm HR, 99% SPO2  MSet designer

## 2022-03-14 NOTE — Progress Notes (Signed)
Pt weaned to RA, saturations maintaining at 90-92%.

## 2022-03-14 NOTE — Progress Notes (Signed)
Mobility Specialist - Progress Note   03/14/22 1554  Mobility  Activity Ambulated independently in hallway  Level of Assistance Independent  Assistive Device None  Distance Ambulated (ft) 40 ft  Activity Response Tolerated well  Mobility Referral Yes  $Mobility charge 1 Mobility   Pt received in bed and agreeable to mobility. Pt had a coughing spell prior to ambulating which prompted short session. Once ambulating pt HR up to 141bpm. Nurse made aware in hallway. Pt to bed after session with all needs met & nurse in room.    [facility]

## 2022-03-15 ENCOUNTER — Other Ambulatory Visit (HOSPITAL_COMMUNITY): Payer: Self-pay

## 2022-03-15 LAB — CBC WITH DIFFERENTIAL/PLATELET
Abs Immature Granulocytes: 0.38 10*3/uL — ABNORMAL HIGH (ref 0.00–0.07)
Basophils Absolute: 0.1 10*3/uL (ref 0.0–0.1)
Basophils Relative: 0 %
Eosinophils Absolute: 0 10*3/uL (ref 0.0–0.5)
Eosinophils Relative: 0 %
HCT: 47.2 % (ref 39.0–52.0)
Hemoglobin: 15.8 g/dL (ref 13.0–17.0)
Immature Granulocytes: 2 %
Lymphocytes Relative: 8 %
Lymphs Abs: 1.4 10*3/uL (ref 0.7–4.0)
MCH: 32 pg (ref 26.0–34.0)
MCHC: 33.5 g/dL (ref 30.0–36.0)
MCV: 95.5 fL (ref 80.0–100.0)
Monocytes Absolute: 1 10*3/uL (ref 0.1–1.0)
Monocytes Relative: 5 %
Neutro Abs: 15.1 10*3/uL — ABNORMAL HIGH (ref 1.7–7.7)
Neutrophils Relative %: 85 %
Platelets: 479 10*3/uL — ABNORMAL HIGH (ref 150–400)
RBC: 4.94 MIL/uL (ref 4.22–5.81)
RDW: 13.2 % (ref 11.5–15.5)
WBC: 17.9 10*3/uL — ABNORMAL HIGH (ref 4.0–10.5)
nRBC: 0 % (ref 0.0–0.2)

## 2022-03-15 LAB — COMPREHENSIVE METABOLIC PANEL
ALT: 55 U/L — ABNORMAL HIGH (ref 0–44)
AST: 32 U/L (ref 15–41)
Albumin: 3.6 g/dL (ref 3.5–5.0)
Alkaline Phosphatase: 54 U/L (ref 38–126)
Anion gap: 6 (ref 5–15)
BUN: 37 mg/dL — ABNORMAL HIGH (ref 8–23)
CO2: 26 mmol/L (ref 22–32)
Calcium: 9.2 mg/dL (ref 8.9–10.3)
Chloride: 102 mmol/L (ref 98–111)
Creatinine, Ser: 0.85 mg/dL (ref 0.61–1.24)
GFR, Estimated: >60 mL/min (ref >60)
Glucose, Bld: 129 mg/dL — ABNORMAL HIGH (ref 70–99)
Potassium: 5.1 mmol/L (ref 3.5–5.1)
Sodium: 134 mmol/L — ABNORMAL LOW (ref 135–145)
Total Bilirubin: 0.5 mg/dL (ref 0.3–1.2)
Total Protein: 7.4 g/dL (ref 6.5–8.1)

## 2022-03-15 LAB — MAGNESIUM: Magnesium: 2.2 mg/dL (ref 1.7–2.4)

## 2022-03-15 LAB — PHOSPHORUS: Phosphorus: 3.9 mg/dL (ref 2.5–4.6)

## 2022-03-15 MED ORDER — ALBUTEROL SULFATE HFA 108 (90 BASE) MCG/ACT IN AERS
2.0000 | INHALATION_SPRAY | RESPIRATORY_TRACT | 2 refills | Status: DC | PRN
  Filled 2022-03-15: qty 6.7, 17d supply, fill #0

## 2022-03-15 MED ORDER — DOXYCYCLINE HYCLATE 100 MG PO TABS
100.0000 mg | ORAL_TABLET | Freq: Two times a day (BID) | ORAL | 0 refills | Status: AC
  Filled 2022-03-15: qty 3, 2d supply, fill #0

## 2022-03-15 MED ORDER — LEVOTHYROXINE SODIUM 50 MCG PO TABS
50.0000 ug | ORAL_TABLET | Freq: Every day | ORAL | 1 refills | Status: DC
  Filled 2022-03-15: qty 30, 30d supply, fill #0

## 2022-03-15 MED ORDER — PREDNISONE 10 MG PO TABS
ORAL_TABLET | ORAL | 0 refills | Status: AC
  Filled 2022-03-15: qty 30, 12d supply, fill #0

## 2022-03-15 NOTE — Discharge Instructions (Addendum)
Attempts are being made to make you a new primary care provider appointment with [facility] on 12/18 with Dr. Laural Benes which you should attend as scheduled Please take all new prescribed medications including your tapering regimen of prednisone, remaining regimen of your antibiotics, albuterol rescue inhaler and new prescription for levothyroxine for your hypothyroidism Please abstain from smoking Please return to the emergency department if you develop worsening shortness of breath, chest pain or weakness.

## 2022-03-15 NOTE — Progress Notes (Signed)
SATURATION QUALIFICATIONS: (This note is used to comply with regulatory documentation for home oxygen)  Patient Saturations on Room Air at Rest = 94%  Patient Saturations on Room Air while Ambulating = 92-94%  Patient ambulated a distance of 74ft on room air while maintaining O2 sats between 92-94%, heart rate between 110s-120s. Patient described a "heart thumping sensation" in chest while ambulating. Returned to room safely, maintains O2 sats on room air at this time.

## 2022-03-15 NOTE — Progress Notes (Signed)
Mobility Specialist - Progress Note   03/15/22 1037  Mobility  Activity Ambulated independently in hallway  Level of Assistance Independent  Assistive Device None  Distance Ambulated (ft) 240 ft  Activity Response Tolerated well  Mobility Referral Yes  $Mobility charge 1 Mobility   Pt received in bed and agreeable to mobility. Pt c/o shortness of breath towards EOS. No other complaints during mobility. Pt to bed after session with all needs met.     During mobility: 96% SpO2   Set designer

## 2022-03-15 NOTE — TOC Transition Note (Signed)
Transition of Care [facility]) - CM/SW Discharge Note   Patient Details  Name: Hesham Womac MRN: [id number] Date of Birth: 1955-08-07  Transition of Care [facility]) CM/SW Contact:  Golda Acre, RN Phone Number: 03/15/2022, 1:42 PM   Clinical Narrative:    121423/patient discharged to return home.  Chart reviewed for TOC needs.  None found.  Patient self care.     Barriers to Discharge: Barriers Resolved   Patient Goals and CMS Choice Patient states their goals for this hospitalization and ongoing recovery are:: to get better CMS Medicare.gov Compare Post Acute Care list provided to:: Patient Choice offered to / list presented to : Patient  Discharge Placement                       Discharge Plan and Services   Discharge Planning Services: MATCH Program, Follow-up appt scheduled, Indigent Health Clinic, Medication Assistance                                 Social Determinants of Health (SDOH) Interventions     Readmission Risk Interventions   No data to display

## 2022-03-15 NOTE — TOC Transition Note (Signed)
Transition of Care [facility]) - CM/SW Discharge Note   Patient Details  Name: Jimmy [name] MRN: [id number] Date of Birth: 02/02/1956  Transition of Care [facility]) CM/SW Contact:  Golda Acre, RN Phone Number: 03/15/2022, 12:54 PM   Clinical Narrative:    Dcd to home, pt has f/[facility] [phone] at 0830.  Added to the dc instructions.     Barriers to Discharge: Continued Medical Work up   Patient Goals and CMS Choice Patient states their goals for this hospitalization and ongoing recovery are:: to get better CMS Medicare.gov Compare Post Acute Care list provided to:: Patient Choice offered to / list presented to : Patient  Discharge Placement                       Discharge Plan and Services   Discharge Planning Services: MATCH Program, Follow-up appt scheduled, [facility], Medication Assistance                                 Social Determinants of Health (SDOH) Interventions     Readmission Risk Interventions   No data to display

## 2022-03-15 NOTE — Discharge Summary (Addendum)
Physician Discharge Summary   Patient: Jimmy [name] MRN: [id number] DOB: 10/08/1955  Admit date:     03/10/2022  Discharge date: 03/15/22  Discharge Physician: Marinda Elk   PCP: Pcp, No   Recommendations at discharge:   Patient instructed to attend his new primary care provider appointment with Dr. Laural Benes on 12/18 at [facility]. Patient instructed to take all prescribed medications exactly as instructed including taking his new regimen of Synthroid for his hypothyroidism, tapering regimen of prednisone, remaining antibiotic therapy and albuterol rescue inhaler. Patient advised to abstain from smoking going forward. Patient should have repeat thyroid testing in approximately 6 weeks on current regimen of levothyroxine.  Discharge Diagnoses: Principal Problem:   COPD with acute exacerbation (HCC) Active Problems:   Acute respiratory failure with hypoxia (HCC)   Hypothyroidism   Hypokalemia   Homelessness   Tobacco abuse   Systolic murmur  Resolved Problems:   * No resolved hospital problems. [facility] Course: Jimmy [name] is a 66 yo male with PMH homelessness, COPD, ongoing tobacco use (but trying to wean some), substance use who presented to the ER with worsening shortness of breath, cough, and wheezing. 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  Upon evaluation in the emergency department patient was clinically felt to be suffering from a COPD exacerbation.  Hospice group was then called to assess the patient for admission to the hospital.  Respiratory viral panel as well as COVID PCR and influenza PCR testing were negative.  Patient was treated with a combination of aggressive bronchodilator therapy, systemic steroids and a course of doxycycline.  Patient was provided with supplemental oxygen for concurrent hypoxia.  Patient was identified to  have hypothyroidism during this hospitalization.  This is a new diagnosis and was confirmed on full thyroid panel.  Patient was initiated on levothyroxine 50 mcg daily which was prescribed to him at time of discharge.  Patient was instructed to follow-up with his new primary care provider for repeat thyroid testing in the near future to assess response to current Synthroid regimen.  In the days to follow patient slowly clinically improved.  Patient was slowly able to be weaned off of supplemental oxygen.  Patient was eventually discharged home with a remaining course of oral prednisone antibiotics and albuterol rescue inhaler.  Arranges were made for patient to follow-up with a new primary care provider on 12/18 at [facility] with Dr. Laural Benes.  Patient was discharged in improved and stable condition.     Consultants: None Procedures performed:  None Disposition: Home Diet recommendation:  Discharge Diet Orders (From admission, onward)     Start     Ordered   03/15/22 0000  Diet general        03/15/22 1249           Regular diet  DISCHARGE MEDICATION: Allergies as of 03/15/2022   No Known Allergies      Medication List     TAKE these medications    albuterol 108 (90 Base) MCG/ACT inhaler Commonly known as: VENTOLIN HFA Inhale 2 puffs into the lungs every 4 (four) hours as needed for wheezing or shortness of breath. What changed: when to take this   doxycycline 100 MG tablet Commonly known as: VIBRA-TABS Take 1 tablet (100 mg total) by mouth 2 (two) times daily for 3 doses.   levothyroxine 50 MCG  tablet Commonly known as: SYNTHROID Take 1 tablet (50 mcg total) by mouth daily at 6 (six) AM. Start taking on: March 16, 2022   predniSONE 10 MG tablet Commonly known as: DELTASONE Take 4 tablets (40 mg total) by mouth daily for 3 days, THEN 3 tablets (30 mg total) daily for 3 days, THEN 2 tablets (20 mg total) daily for 3 days, THEN 1 tablet (10 mg  total) daily for 3 days. Start taking on: March 15, 2022        Follow-up Information     [facility]. Go on 03/19/2022.   Why: go to the clinic at 0815am Monday December 18,2023 for a scheduled appointment with Dr. Laural Benes at [facility] information: [address] [place] [address] [phone]                Discharge Exam: Jimmy [name] Weights   03/10/22 0246  Weight: 53.5 kg    Constitutional: Awake alert and oriented x3, no associated distress.   Respiratory: clear to auscultation bilaterally, no wheezing, no crackles. Normal respiratory effort. No accessory muscle use.  Cardiovascular: Regular rate and rhythm, no murmurs / rubs / gallops. No extremity edema. 2+ pedal pulses. No carotid bruits.  Abdomen: Abdomen is soft and nontender.  No evidence of intra-abdominal masses.  Positive bowel sounds noted in all quadrants.   Musculoskeletal: No joint deformity upper and lower extremities. Good ROM, no contractures. Normal muscle tone.     Condition at discharge: fair  The results of significant diagnostics from this hospitalization (including imaging, microbiology, ancillary and laboratory) are listed below for reference.   Imaging Studies: DG Chest 1 View  Result Date: 03/14/2022 CLINICAL DATA:  Acute COPD exacerbation, initial encounter EXAM: PORTABLE CHEST 1 VIEW COMPARISON:  03/10/2022 FINDINGS: The heart size and mediastinal contours are within normal limits. Both lungs are clear. The visualized skeletal structures show old right rib fractures. IMPRESSION: No active disease. Electronically Signed   By: Alcide Clever M.D.   On: 03/14/2022 19:51   ECHOCARDIOGRAM COMPLETE  Result Date: 03/10/2022    ECHOCARDIOGRAM REPORT   Patient Name:   Jimmy [name] Date of Exam: 03/10/2022 Medical Rec #:  [id number]     Height:       61.0 in Accession #:    [id number]    Weight:       118.0 lb Date of Birth:  06-12-1955      BSA:          1.509 m Patient Age:    66 years      BP:           146/71 mmHg Patient Gender: M             HR:           61 bpm. Exam Location:  Inpatient Procedure: 2D Echo, Color Doppler and Cardiac Doppler Indications:    Murmur  History:        Patient has no prior history of Echocardiogram examinations.                 COPD, Signs/Symptoms:Shortness of Breath; Risk Factors:Current                 Smoker.  Sonographer:    Milda Smart Referring Phys: [id number] ALLISON WOLFE  Sonographer Comments: Suboptimal parasternal window. Image acquisition challenging due to patient body habitus, Image acquisition challenging due to respiratory motion and Image acquisition challenging due  to COPD. IMPRESSIONS  1. Left ventricular ejection fraction, by estimation, is 65 to 70%. The left ventricle has normal function. The left ventricle has no regional wall motion abnormalities. Left ventricular diastolic parameters were normal.  2. Right ventricular systolic function is normal. The right ventricular size is normal. Tricuspid regurgitation signal is inadequate for assessing PA pressure.  3. The mitral valve is grossly normal. No evidence of mitral valve regurgitation. No evidence of mitral stenosis.  4. The aortic valve was not well visualized. Aortic valve regurgitation is not visualized. No aortic stenosis is present.  5. The inferior vena cava is normal in size with greater than 50% respiratory variability, suggesting right atrial pressure of 3 mmHg. FINDINGS  Left Ventricle: Left ventricular ejection fraction, by estimation, is 65 to 70%. The left ventricle has normal function. The left ventricle has no regional wall motion abnormalities. The left ventricular internal cavity size was normal in size. There is  no left ventricular hypertrophy. Left ventricular diastolic parameters were normal. Right Ventricle: The right ventricular size is normal. No increase in right ventricular wall thickness. Right ventricular  systolic function is normal. Tricuspid regurgitation signal is inadequate for assessing PA pressure. Left Atrium: Left atrial size was normal in size. Right Atrium: Right atrial size was normal in size. Pericardium: Trivial pericardial effusion is present. Mitral Valve: The mitral valve is grossly normal. No evidence of mitral valve regurgitation. No evidence of mitral valve stenosis. Tricuspid Valve: The tricuspid valve is grossly normal. Tricuspid valve regurgitation is trivial. No evidence of tricuspid stenosis. Aortic Valve: The aortic valve was not well visualized. Aortic valve regurgitation is not visualized. No aortic stenosis is present. Pulmonic Valve: The pulmonic valve was grossly normal. Pulmonic valve regurgitation is not visualized. No evidence of pulmonic stenosis. Aorta: The aortic root is normal in size and structure. Venous: The inferior vena cava is normal in size with greater than 50% respiratory variability, suggesting right atrial pressure of 3 mmHg. IAS/Shunts: The atrial septum is grossly normal.  LEFT VENTRICLE PLAX 2D LVIDd:         4.00 cm     Diastology LVIDs:         2.90 cm     LV e' medial:    8.70 cm/s LV PW:         0.50 cm     LV E/e' medial:  7.1 LV IVS:        0.50 cm     LV e' lateral:   11.40 cm/s LVOT diam:     2.10 cm     LV E/e' lateral: 5.4 LV SV:         90 LV SV Index:   59 LVOT Area:     3.46 cm  LV Volumes (MOD) LV vol d, MOD A2C: 87.2 ml LV vol d, MOD A4C: 80.7 ml LV vol s, MOD A2C: 14.6 ml LV vol s, MOD A4C: 20.9 ml LV SV MOD A2C:     72.6 ml LV SV MOD A4C:     80.7 ml LV SV MOD BP:      68.9 ml RIGHT VENTRICLE             IVC RV S prime:     14.80 cm/s  IVC diam: 0.80 cm LEFT ATRIUM             Index        RIGHT ATRIUM           Index LA  diam:        2.90 cm 1.92 cm/m   RA Area:     12.90 cm LA Vol (A2C):   31.1 ml 20.61 ml/m  RA Volume:   28.10 ml  18.62 ml/m LA Vol (A4C):   19.2 ml 12.72 ml/m LA Biplane Vol: 26.0 ml 17.23 ml/m  AORTIC VALVE LVOT Vmax:    146.00 cm/s LVOT Vmean:  93.600 cm/s LVOT VTI:    0.259 m  AORTA Ao Root diam: 2.70 cm MITRAL VALVE MV Area (PHT): 2.00 cm    SHUNTS MV Decel Time: 380 msec    Systemic VTI:  0.26 m MV E velocity: 62.10 cm/s  Systemic Diam: 2.10 cm MV A velocity: 74.20 cm/s MV E/A ratio:  0.84 Lennie OdorWesley O'Neal MD Electronically signed by Lennie OdorWesley O'Neal MD Signature Date/Time: 03/10/2022/12:09:47 PM    Final    DG Chest Portable 1 View  Result Date: 03/10/2022 CLINICAL DATA:  Shortness of breath EXAM: PORTABLE CHEST 1 VIEW COMPARISON:  02/05/2021 FINDINGS: Cardiac shadow is within normal limits. Lungs are well aerated without focal infiltrate or effusion. No bony abnormality is noted. IMPRESSION: No acute abnormality seen. Electronically Signed   By: Alcide CleverMark  Lukens M.D.   On: 03/10/2022 02:50    Microbiology: Results for orders placed or performed during the hospital encounter of 03/10/22  Resp Panel by RT-PCR (Flu A&B, Covid) Anterior Nasal Swab     Status: None   Collection Time: 03/10/22  2:33 AM   Specimen: Anterior Nasal Swab  Result Value Ref Range Status   SARS Coronavirus 2 by RT PCR NEGATIVE NEGATIVE Final    Comment: (NOTE) SARS-CoV-2 target nucleic acids are NOT DETECTED.  The SARS-CoV-2 RNA is generally detectable in upper respiratory specimens during the acute phase of infection. The lowest concentration of SARS-CoV-2 viral copies this assay can detect is 138 copies/mL. A negative result does not preclude SARS-Cov-2 infection and should not be used as the sole basis for treatment or other patient management decisions. A negative result may occur with  improper specimen collection/handling, submission of specimen other than nasopharyngeal swab, presence of viral mutation(s) within the areas targeted by this assay, and inadequate number of viral copies(<138 copies/mL). A negative result must be combined with clinical observations, patient history, and epidemiological information. The expected result is  Negative.  Fact Sheet for Patients:  BloggerCourse.comhttps://www.fda.gov/media/152166/download  Fact Sheet for Healthcare Providers:  SeriousBroker.ithttps://www.fda.gov/media/152162/download  This test is no t yet approved or cleared by the [place] FDA and  has been authorized for detection and/or diagnosis of SARS-CoV-2 by FDA under an Emergency Use Authorization (EUA). This EUA will remain  in effect (meaning this test can be used) for the duration of the COVID-19 declaration under Section 564(b)(1) of the Act, 21 U.S.C.section 360bbb-3(b)(1), unless the authorization is terminated  or revoked sooner.       Influenza A by PCR NEGATIVE NEGATIVE Final   Influenza B by PCR NEGATIVE NEGATIVE Final    Comment: (NOTE) The Xpert Xpress SARS-CoV-2/FLU/RSV plus assay is intended as an aid in the diagnosis of influenza from Nasopharyngeal swab specimens and should not be used as a sole basis for treatment. Nasal washings and aspirates are unacceptable for Xpert Xpress SARS-CoV-2/FLU/RSV testing.  Fact Sheet for Patients: BloggerCourse.comhttps://www.fda.gov/media/152166/download  Fact Sheet for Healthcare Providers: SeriousBroker.ithttps://www.fda.gov/media/152162/download  This test is not yet approved or cleared by the [place] FDA and has been authorized for detection and/or diagnosis of SARS-CoV-2 by FDA under an Emergency Use Authorization (EUA).  This EUA will remain in effect (meaning this test can be used) for the duration of the COVID-19 declaration under Section 564(b)(1) of the Act, 21 U.S.C. section 360bbb-3(b)(1), unless the authorization is terminated or revoked.  Performed at [facility], [address], [place], [place] [address]   Respiratory (~20 pathogens) panel by PCR     Status: None   Collection Time: 03/10/22 12:38 PM   Specimen: Nasopharyngeal Swab; Respiratory  Result Value Ref Range Status   Adenovirus NOT DETECTED NOT DETECTED Final   Coronavirus 229E NOT DETECTED NOT DETECTED Final     Comment: (NOTE) The Coronavirus on the Respiratory Panel, DOES NOT test for the novel  Coronavirus (2019 nCoV)    Coronavirus HKU1 NOT DETECTED NOT DETECTED Final   Coronavirus NL63 NOT DETECTED NOT DETECTED Final   Coronavirus OC43 NOT DETECTED NOT DETECTED Final   Metapneumovirus NOT DETECTED NOT DETECTED Final   Rhinovirus / Enterovirus NOT DETECTED NOT DETECTED Final   Influenza A NOT DETECTED NOT DETECTED Final   Influenza B NOT DETECTED NOT DETECTED Final   Parainfluenza Virus 1 NOT DETECTED NOT DETECTED Final   Parainfluenza Virus 2 NOT DETECTED NOT DETECTED Final   Parainfluenza Virus 3 NOT DETECTED NOT DETECTED Final   Parainfluenza Virus 4 NOT DETECTED NOT DETECTED Final   Respiratory Syncytial Virus NOT DETECTED NOT DETECTED Final   Bordetella pertussis NOT DETECTED NOT DETECTED Final   Bordetella Parapertussis NOT DETECTED NOT DETECTED Final   Chlamydophila pneumoniae NOT DETECTED NOT DETECTED Final   Mycoplasma pneumoniae NOT DETECTED NOT DETECTED Final    Comment: Performed at [facility], [address] [place], [place] [address]  Expectorated Sputum Assessment w Gram Stain, Rflx to Resp Cult     Status: None   Collection Time: 03/10/22 12:38 PM   Specimen: Sputum  Result Value Ref Range Status   Specimen Description SPUTUM  Final   Special Requests NONE  Final   Sputum evaluation   Final    THIS SPECIMEN IS ACCEPTABLE FOR SPUTUM CULTURE Performed at [facility], [address] [place], [place] [address]    Report Status 03/10/2022 FINAL  Final  Culture, Respiratory w Gram Stain     Status: None   Collection Time: 03/10/22 12:38 PM   Specimen: SPU  Result Value Ref Range Status   Specimen Description   Final    SPUTUM Performed at [facility], [address], [place], [place] [address]    Special Requests   Final    NONE Reflexed from [id number] Performed at [facility], [address],  [place], [place] [address]    Gram Stain   Final    FEW GRAM POSITIVE COCCI IN PAIRS IN CLUSTERS RARE GRAM NEGATIVE RODS RARE YEAST RARE WBC PRESENT, PREDOMINANTLY PMN RARE SQUAMOUS EPITHELIAL CELLS PRESENT    Culture   Final    Normal respiratory flora-no Staph aureus or Pseudomonas seen Performed at [facility], [address] [place], [place] [address]    Report Status 03/13/2022 FINAL  Final    Labs: CBC: Recent Labs  Lab 03/10/22 0314 03/11/22 0448 03/15/22 0441  WBC 7.1 14.4* 17.9*  NEUTROABS 4.7  --  15.1*  HGB 14.2 13.9 15.8  HCT 42.2 40.9 47.2  MCV 94.2 94.5 95.5  PLT 344 375 479*   Basic Metabolic Panel: Recent Labs  Lab 03/10/22 0314 03/10/22 0742 03/11/22 0448 03/15/22 0441  NA 140  --  137 134*  K 3.3*  --  4.2 5.1  CL 107  --  108 102  CO2 24  --  23 26  GLUCOSE 126*  --  97 129*  BUN 20  --  24* 37*  CREATININE 0.85  --  0.76 0.85  CALCIUM 8.5*  --  8.7* 9.2  MG  --  2.2  --  2.2  PHOS  --   --   --  3.9   Liver Function Tests: Recent Labs  Lab 03/10/22 0742 03/15/22 0441  AST 28 32  ALT 22 55*  ALKPHOS 53 54  BILITOT 0.3 0.5  PROT 6.8 7.4  ALBUMIN 3.3* 3.6   CBG: No results for input(s): "GLUCAP" in the last 168 hours.  Discharge time spent: greater than 30 minutes.  Signed: Marinda Elk, MD [facility] 03/15/2022

## 2022-03-19 ENCOUNTER — Inpatient Hospital Stay: Payer: Self-pay | Admitting: Internal Medicine

## 2022-04-19 ENCOUNTER — Emergency Department (HOSPITAL_COMMUNITY)
Admission: EM | Admit: 2022-04-19 | Discharge: 2022-04-20 | Disposition: A | Discharge status: Home / Self Care | Payer: Medicaid Other | Attending: Emergency Medicine | Admitting: Emergency Medicine

## 2022-04-19 ENCOUNTER — Other Ambulatory Visit: Payer: Self-pay

## 2022-04-19 ENCOUNTER — Emergency Department (HOSPITAL_COMMUNITY): Payer: Medicaid Other

## 2022-04-19 ENCOUNTER — Encounter (HOSPITAL_COMMUNITY): Payer: Self-pay

## 2022-04-19 DIAGNOSIS — R0789 Other chest pain: Secondary | ICD-10-CM | POA: Diagnosis not present

## 2022-04-19 DIAGNOSIS — J449 Chronic obstructive pulmonary disease, unspecified: Secondary | ICD-10-CM | POA: Insufficient documentation

## 2022-04-19 DIAGNOSIS — Z79890 Hormone replacement therapy (postmenopausal): Secondary | ICD-10-CM | POA: Insufficient documentation

## 2022-04-19 DIAGNOSIS — U071 COVID-19: Secondary | ICD-10-CM | POA: Diagnosis not present

## 2022-04-19 DIAGNOSIS — E039 Hypothyroidism, unspecified: Secondary | ICD-10-CM | POA: Diagnosis not present

## 2022-04-19 DIAGNOSIS — R072 Precordial pain: Secondary | ICD-10-CM | POA: Diagnosis present

## 2022-04-19 LAB — CBC WITH DIFFERENTIAL/PLATELET
Abs Immature Granulocytes: 0.07 10*3/uL (ref 0.00–0.07)
Basophils Absolute: 0.1 10*3/uL (ref 0.0–0.1)
Basophils Relative: 0 %
Eosinophils Absolute: 0.1 10*3/uL (ref 0.0–0.5)
Eosinophils Relative: 1 %
HCT: 40.1 % (ref 39.0–52.0)
Hemoglobin: 13.2 g/dL (ref 13.0–17.0)
Immature Granulocytes: 1 %
Lymphocytes Relative: 12 %
Lymphs Abs: 1.6 10*3/uL (ref 0.7–4.0)
MCH: 30.8 pg (ref 26.0–34.0)
MCHC: 32.9 g/dL (ref 30.0–36.0)
MCV: 93.7 fL (ref 80.0–100.0)
Monocytes Absolute: 1.3 10*3/uL — ABNORMAL HIGH (ref 0.1–1.0)
Monocytes Relative: 10 %
Neutro Abs: 10.4 10*3/uL — ABNORMAL HIGH (ref 1.7–7.7)
Neutrophils Relative %: 76 %
Platelets: 431 10*3/uL — ABNORMAL HIGH (ref 150–400)
RBC: 4.28 MIL/uL (ref 4.22–5.81)
RDW: 12.7 % (ref 11.5–15.5)
WBC: 13.6 10*3/uL — ABNORMAL HIGH (ref 4.0–10.5)
nRBC: 0 % (ref 0.0–0.2)

## 2022-04-19 LAB — URINALYSIS, ROUTINE W REFLEX MICROSCOPIC
Bacteria, UA: NONE SEEN
Bilirubin Urine: NEGATIVE
Glucose, UA: NEGATIVE mg/dL
Hgb urine dipstick: NEGATIVE
Ketones, ur: NEGATIVE mg/dL
Leukocytes,Ua: NEGATIVE
Nitrite: NEGATIVE
Protein, ur: 30 mg/dL — AB
Specific Gravity, Urine: 1.023 (ref 1.005–1.030)
pH: 5 (ref 5.0–8.0)

## 2022-04-19 LAB — COMPREHENSIVE METABOLIC PANEL
ALT: 12 U/L (ref 0–44)
AST: 19 U/L (ref 15–41)
Albumin: 2.7 g/dL — ABNORMAL LOW (ref 3.5–5.0)
Alkaline Phosphatase: 68 U/L (ref 38–126)
Anion gap: 10 (ref 5–15)
BUN: 15 mg/dL (ref 8–23)
CO2: 23 mmol/L (ref 22–32)
Calcium: 8.3 mg/dL — ABNORMAL LOW (ref 8.9–10.3)
Chloride: 102 mmol/L (ref 98–111)
Creatinine, Ser: 0.95 mg/dL (ref 0.61–1.24)
GFR, Estimated: >60 mL/min (ref >60)
Glucose, Bld: 109 mg/dL — ABNORMAL HIGH (ref 70–99)
Potassium: 4.2 mmol/L (ref 3.5–5.1)
Sodium: 135 mmol/L (ref 135–145)
Total Bilirubin: 0.3 mg/dL (ref 0.3–1.2)
Total Protein: 6.6 g/dL (ref 6.5–8.1)

## 2022-04-19 LAB — TSH: TSH: 7.882 u[IU]/mL — ABNORMAL HIGH (ref 0.350–4.500)

## 2022-04-19 LAB — RESP PANEL BY RT-PCR (RSV, FLU A&B, COVID)  RVPGX2
Influenza A by PCR: NEGATIVE
Influenza B by PCR: NEGATIVE
Resp Syncytial Virus by PCR: NEGATIVE
SARS Coronavirus 2 by RT PCR: POSITIVE — AB

## 2022-04-19 LAB — LIPASE, BLOOD: Lipase: 41 U/L (ref 11–51)

## 2022-04-19 LAB — TROPONIN I (HIGH SENSITIVITY)
Troponin I (High Sensitivity): 5 ng/L (ref <18)
Troponin I (High Sensitivity): 3 ng/L (ref <18)

## 2022-04-19 LAB — BRAIN NATRIURETIC PEPTIDE: B Natriuretic Peptide: 51.1 pg/mL (ref 0.0–100.0)

## 2022-04-19 MED ORDER — SODIUM CHLORIDE 0.9 % IV BOLUS
1000.0000 mL | Freq: Once | INTRAVENOUS | Status: AC
  Administered 2022-04-19: 1000 mL via INTRAVENOUS

## 2022-04-19 MED ORDER — IOHEXOL 350 MG/ML SOLN
75.0000 mL | Freq: Once | INTRAVENOUS | Status: AC | PRN
  Administered 2022-04-19: 75 mL via INTRAVENOUS

## 2022-04-19 NOTE — ED Notes (Signed)
Pt stated "If I have to sit out here longer then 6 hours I'm going to raise hell"

## 2022-04-19 NOTE — ED Triage Notes (Signed)
Pt arrived to ED via EMS from [place] w/ c/o sudden onset L sided CP at 1530. Pt denies radiation. Pt reports productive cough x 3 days which makes the pain worse. 324mg  ASA and 0.4mg  nitro given. 20g L AC. VSS w/ EMS. Pt also reports chills. A&Ox4 and in NAD at this time.

## 2022-04-19 NOTE — Discharge Instructions (Signed)
You were evaluated in the Emergency Department and after careful evaluation, we did not find any emergent condition requiring admission or further testing in the hospital.  Your exam/testing today was overall reassuring.  Symptoms seem to be due to COVID-19.  You tested positive here in the emergency department.  Recommend Tylenol and Motrin for discomfort, plenty of fluids and rest.  Please return to the Emergency Department if you experience any worsening of your condition.  Thank you for allowing [place] to be a part of your care.

## 2022-04-19 NOTE — ED Provider Notes (Signed)
[facility] EMERGENCY DEPARTMENT Provider Note   CSN: [id number] Arrival date & time: 04/19/22  1639     History  Chief Complaint  Patient presents with   Chest Pain   Cough   Chills    Jimmy [name] is a 67 y.o. male.  The history is provided by the patient and medical records. No language interpreter was used.  Chest Pain Pain location:  Substernal area Pain quality: aching, pressure and sharp   Pain radiates to:  Does not radiate Pain severity:  Moderate Onset quality:  Gradual Duration:  3 days Timing:  Intermittent Progression:  Waxing and waning Chronicity:  Recurrent Relieved by:  Nothing Worsened by:  Coughing and deep breathing Ineffective treatments:  None tried Associated symptoms: cough, fatigue, nausea, shortness of breath and vomiting   Associated symptoms: no abdominal pain, no altered mental status, no back pain, no diaphoresis, no fever, no headache, no lower extremity edema, no near-syncope, no palpitations and no weakness   Cough Associated symptoms: chest pain, chills and shortness of breath   Associated symptoms: no diaphoresis, no fever, no headaches, no rash and no wheezing        Home Medications Prior to Admission medications   Medication Sig Start Date End Date Taking? Authorizing Provider  albuterol (VENTOLIN HFA) 108 (90 Base) MCG/ACT inhaler Inhale 2 puffs into the lungs every 4 (four) hours as needed for wheezing or shortness of breath. 03/15/22   Shalhoub, Sherryll Burger, MD  levothyroxine (SYNTHROID) 50 MCG tablet Take 1 tablet (50 mcg total) by mouth daily at 6 (six) AM. 03/16/22   Shalhoub, Sherryll Burger, MD      Allergies    Patient has no known allergies.    Review of Systems   Review of Systems  Constitutional:  Positive for chills and fatigue. Negative for diaphoresis and fever.  HENT:  Positive for congestion.   Eyes:  Negative for visual disturbance.  Respiratory:  Positive for cough, chest tightness and shortness  of breath. Negative for wheezing.   Cardiovascular:  Positive for chest pain. Negative for palpitations and near-syncope.  Gastrointestinal:  Positive for diarrhea, nausea and vomiting. Negative for abdominal pain and constipation.  Genitourinary:  Positive for frequency. Negative for dysuria and flank pain.  Musculoskeletal:  Negative for back pain, neck pain and neck stiffness.  Skin:  Negative for rash and wound.  Neurological:  Negative for weakness, light-headedness and headaches.  Psychiatric/Behavioral:  Negative for agitation.   All other systems reviewed and are negative.   Physical Exam Updated Vital Signs BP 91/61   Pulse 98   Temp 97.7 F (36.5 C) (Oral)   Resp 16   Ht 5\' 1"  (1.549 m)   Wt 53.5 kg   SpO2 98%   BMI 22.30 kg/m  Physical Exam Vitals and nursing note reviewed.  Constitutional:      General: He is not in acute distress.    Appearance: He is well-developed. He is not ill-appearing, toxic-appearing or diaphoretic.  HENT:     Head: Normocephalic and atraumatic.  Eyes:     Conjunctiva/sclera: Conjunctivae normal.     Pupils: Pupils are equal, round, and reactive to light.  Cardiovascular:     Rate and Rhythm: Normal rate and regular rhythm.     Heart sounds: Normal heart sounds. No murmur heard. Pulmonary:     Effort: Pulmonary effort is normal. No respiratory distress.     Breath sounds: Normal breath sounds. No decreased breath sounds, wheezing,  rhonchi or rales.  Chest:     Chest wall: No tenderness.  Abdominal:     Palpations: Abdomen is soft.     Tenderness: There is no abdominal tenderness.  Musculoskeletal:        General: No swelling.     Cervical back: Neck supple.     Right lower leg: No tenderness. No edema.     Left lower leg: No tenderness. No edema.  Skin:    General: Skin is warm and dry.     Capillary Refill: Capillary refill takes less than 2 seconds.     Findings: No erythema or rash.  Neurological:     General: No focal  deficit present.     Mental Status: He is alert.  Psychiatric:        Mood and Affect: Mood normal.     ED Results / Procedures / Treatments   Labs (all labs ordered are listed, but only abnormal results are displayed) Labs Reviewed  RESP PANEL BY RT-PCR (RSV, FLU A&B, COVID)  RVPGX2 - Abnormal; Notable for the following components:      Result Value   SARS Coronavirus 2 by RT PCR POSITIVE (*)    All other components within normal limits  CBC WITH DIFFERENTIAL/PLATELET - Abnormal; Notable for the following components:   WBC 13.6 (*)    Platelets 431 (*)    Neutro Abs 10.4 (*)    Monocytes Absolute 1.3 (*)    All other components within normal limits  COMPREHENSIVE METABOLIC PANEL - Abnormal; Notable for the following components:   Glucose, Bld 109 (*)    Calcium 8.3 (*)    Albumin 2.7 (*)    All other components within normal limits  URINALYSIS, ROUTINE W REFLEX MICROSCOPIC - Abnormal; Notable for the following components:   Color, Urine AMBER (*)    APPearance HAZY (*)    Protein, ur 30 (*)    All other components within normal limits  TSH - Abnormal; Notable for the following components:   TSH 7.882 (*)    All other components within normal limits  URINE CULTURE  BRAIN NATRIURETIC PEPTIDE  LIPASE, BLOOD  TROPONIN I (HIGH SENSITIVITY)  TROPONIN I (HIGH SENSITIVITY)    EKG EKG Interpretation  Date/Time:  Thursday April 19 2022 16:44:05 EST Ventricular Rate:  122 PR Interval:  136 QRS Duration: 64 QT Interval:  292 QTC Calculation: 416 R Axis:   87 Text Interpretation: Sinus tachycardia Otherwise normal ECG When compared with ECG of 10-Mar-2022 02:36, PREVIOUS ECG IS PRESENT when comapred to prior, faster rate. No STEMI Confirmed by Theda Belfast ([id number]) on 04/19/2022 6:57:09 PM  Radiology CT Angio Chest PE W and/or Wo Contrast  Result Date: 04/19/2022 CLINICAL DATA:  Left-sided chest pain several hours ago, initial encounter EXAM: CT ANGIOGRAPHY CHEST WITH  CONTRAST TECHNIQUE: Multidetector CT imaging of the chest was performed using the standard protocol during bolus administration of intravenous contrast. Multiplanar CT image reconstructions and MIPs were obtained to evaluate the vascular anatomy. RADIATION DOSE REDUCTION: This exam was performed according to the departmental dose-optimization program which includes automated exposure control, adjustment of the mA and/or kV according to patient size and/or use of iterative reconstruction technique. CONTRAST:  17mL OMNIPAQUE IOHEXOL 350 MG/ML SOLN COMPARISON:  Chest x-ray from earlier in the same day. FINDINGS: Cardiovascular: Atherosclerotic calcifications of the thoracic aorta are noted. No aneurysmal dilatation or dissection is noted. No cardiac enlargement is seen. The pulmonary artery is well visualized within normal branching  pattern. No intraluminal filling defect to suggest pulmonary embolism is seen. Mediastinum/Nodes: Thoracic inlet is within normal limits. No hilar or mediastinal adenopathy is noted. The esophagus is within normal limits. Lungs/Pleura: Mild emphysematous changes are noted throughout both lungs. No focal infiltrate or sizable effusion is seen. No parenchymal nodules are noted. Upper Abdomen: Visualized upper abdomen is within normal limits. Musculoskeletal: No acute rib abnormality is noted. Degenerative changes of the thoracic spine are seen. Review of the MIP images confirms the above findings. IMPRESSION: No evidence of pulmonary emboli. No acute abnormality noted. Aortic Atherosclerosis (ICD10-I70.0) and Emphysema (ICD10-J43.9). Electronically Signed   By: Inez Catalina M.D.   On: 04/19/2022 22:03   DG Chest 2 View  Result Date: 04/19/2022 CLINICAL DATA:  Cough and shortness of breath EXAM: CHEST - 2 VIEW COMPARISON:  One-view x-ray 03/14/2022 FINDINGS: Hyperinflation. No consolidation, pneumothorax or effusion. No edema. Normal cardiopericardial silhouette without edema. Degenerative  changes are seen along the spine IMPRESSION: Hyperinflation.  No acute cardiopulmonary disease Electronically Signed   By: Jill Side M.D.   On: 04/19/2022 18:11    Procedures Procedures    Medications Ordered in ED Medications  iohexol (OMNIPAQUE) 350 MG/ML injection 75 mL (75 mLs Intravenous Contrast Given 04/19/22 2149)  sodium chloride 0.9 % bolus 1,000 mL (0 mLs Intravenous Stopped 04/19/22 2329)    ED Course/ Medical Decision Making/ A&P                             Medical Decision Making Amount and/or Complexity of Data Reviewed Labs: ordered. Radiology: ordered.  Risk Prescription drug management.   Jimmy [name] is a 67 y.o. male with a past medical history significant for hypothyroidism, COPD, tobacco abuse, and previous appendectomy who presents with chronic chest pain that has been associated with several days of congestion, cough, subjective fevers, chills, nausea, vomiting, diarrhea, and urinary frequency.  He reports that he has had pleuritic chest discomfort but denies history of blood clots.  He denies any leg symptoms.  He reports he chronically has chest pain for many months.  On exam, lungs were clear and chest was slightly tender.  Abdomen nontender.  Good pulses in extremities.  Legs nontender nonedematous.  Vital signs reassuring initially with no fever or hypoxia however he was slightly tachycardic.  EKG does not show STEMI.  Given his tachycardia and pleuritic symptoms we agreed to get a CT PE study to rule out pulmonary embolism.  Will check for viral illness such as COVID.  Workup returned showing COVID.  PE study was negative for pneumonia or COVID-19.  Patient was slightly tachycardic and was feeling dehydrated so we will give some fluids.  Patient was ambulated with nursing and he did not get hypoxic but did report feeling slightly fatigued and his heart rate dropped into the 40s and 50s.  This subsequently resolved.  If heart rate improves and  patient is tolerating p.o., anticipate discharge home for outpatient follow-up for his COVID-19 illness.     Care transferred to oncoming team to await reassessment after fluids.        Final Clinical Impression(s) / ED Diagnoses Final diagnoses:  COVID-19    Clinical Impression: 1. COVID-19     Disposition: Care transferred to oncoming team to await reassessment after fluids.  This note was prepared with assistance of Systems analyst. Occasional wrong-word or sound-a-like substitutions may have occurred due to the inherent limitations of voice  recognition software.      Jimmy [name], Gwenyth Allegra, MD 04/19/22 [phone]

## 2022-04-19 NOTE — ED Provider Notes (Signed)
  Provider Note MRN:  [id number]  Arrival date & time: 04/19/22    ED Course and Medical Decision Making  Assumed care from Dr. Edward Qualia at shift change.  67 year old here with viral illness, has tested positive for COVID-19.  No hypoxia, had some tachycardia that has resolved with fluids.  Otherwise reassuring workup, appropriate for discharge.  Procedures  Final Clinical Impressions(s) / ED Diagnoses     ICD-10-CM   1. COVID-19  U07.1       ED Discharge Orders     None         Discharge Instructions      You were evaluated in the Emergency Department and after careful evaluation, we did not find any emergent condition requiring admission or further testing in the hospital.  Your exam/testing today was overall reassuring.  Symptoms seem to be due to COVID-19.  You tested positive here in the emergency department.  Recommend Tylenol and Motrin for discomfort, plenty of fluids and rest.  Please return to the Emergency Department if you experience any worsening of your condition.  Thank you for allowing [place] to be a part of your care.     Barth Kirks. Sedonia Small, [place] [email or web address]    Maudie Flakes, MD 04/19/22 2350

## 2022-04-19 NOTE — ED Provider Triage Note (Signed)
Emergency Medicine Provider Triage Evaluation Note  Jimmy [name] , a 67 y.o. male  was evaluated in triage.  Pt complains of chest pain and SOB over the past three days. Patient has been having chronic intermittent nasuea as well. Reports a productive cough and chills. Reports the pain is sharp in his left chest and non radiating.   Review of Systems  Positive:  Negative:   Physical Exam  BP 116/84 (BP Location: Right Arm)   Pulse (!) 107   Temp 97.7 F (36.5 C) (Oral)   Resp 20   SpO2 97%  Gen:   Awake, no distress   Resp:  Normal effort, diminished. Speaking in full sentences without distress.  MSK:   Moves extremities without difficulty  Other:  Mild tachycardia  Medical Decision Making  Medically screening exam initiated at 4:43 PM.  Appropriate orders placed.  Jimmy [name] was informed that the remainder of the evaluation will be completed by another provider, this initial triage assessment does not replace that evaluation, and the importance of remaining in the ED until their evaluation is complete.  Chest pain labs. CXR. EKG.   Jimmy [name], [place] 04/19/22 1645

## 2022-04-22 LAB — URINE CULTURE: Culture: 30000 — AB

## 2022-05-04 IMAGING — CR DG CHEST 2V
2 series · 3 of 3 positions shown · non-contrast
Comparison: 08/30/2006

CLINICAL DATA: Shortness of breath and cough

EXAM:
CHEST - 2 VIEW

[chest pa]
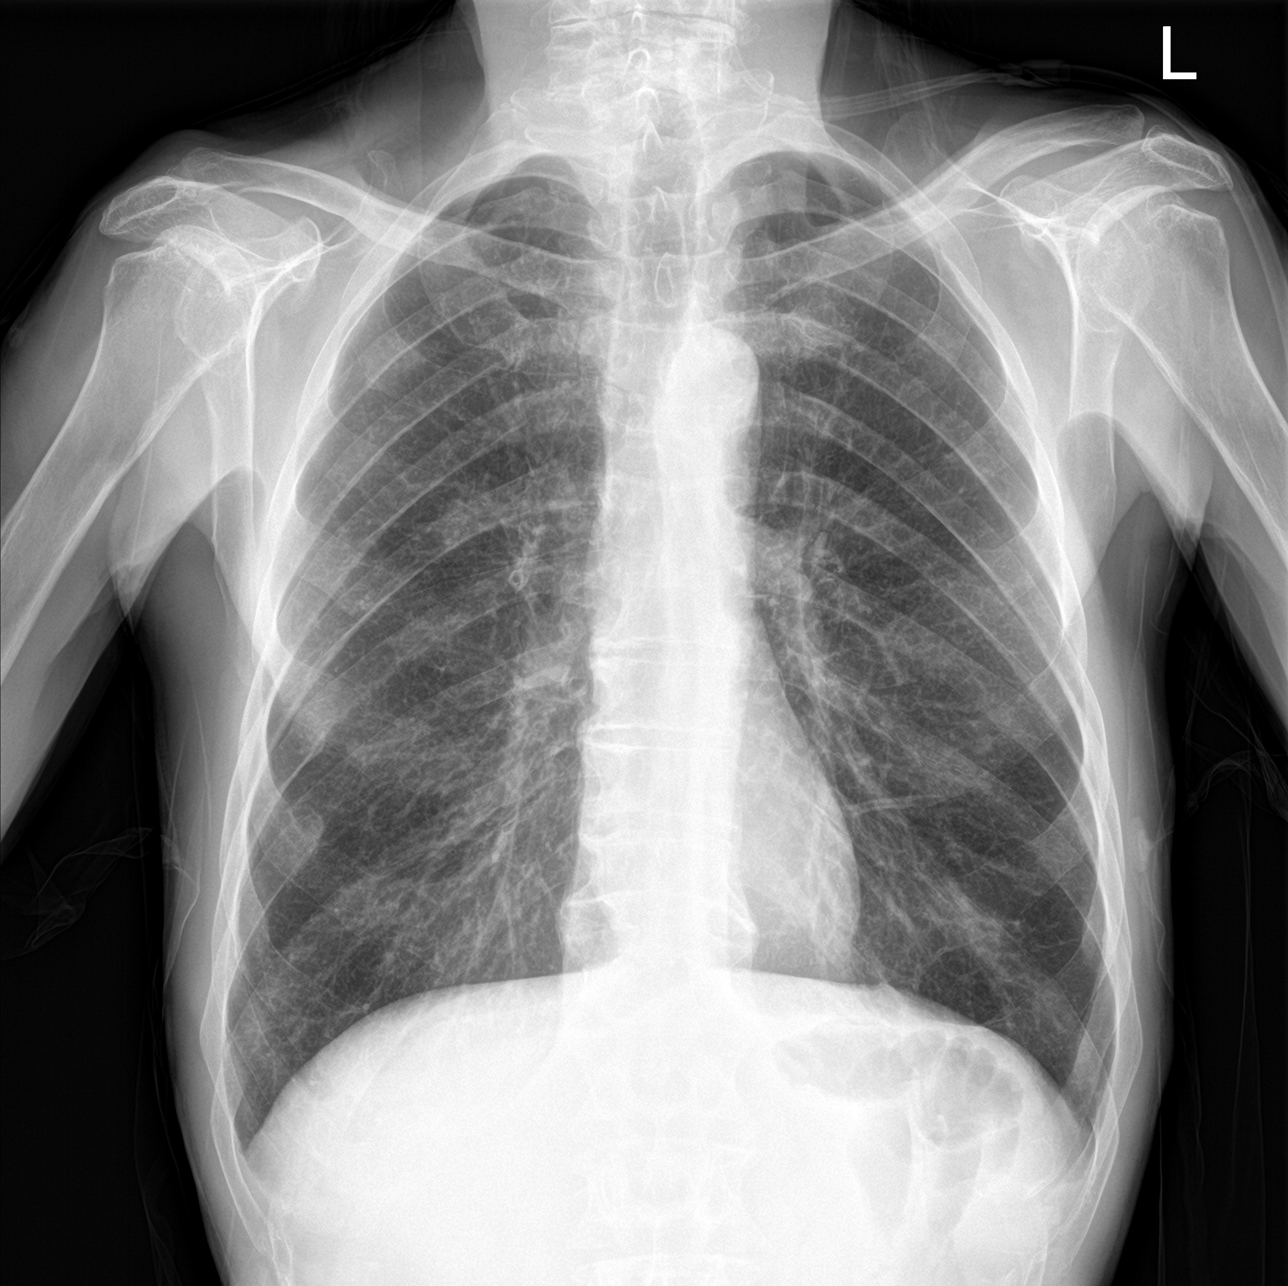

[Series 2: chest lat · 0.14mm/px · 2 of 2 slices shown]
[im 1/2]
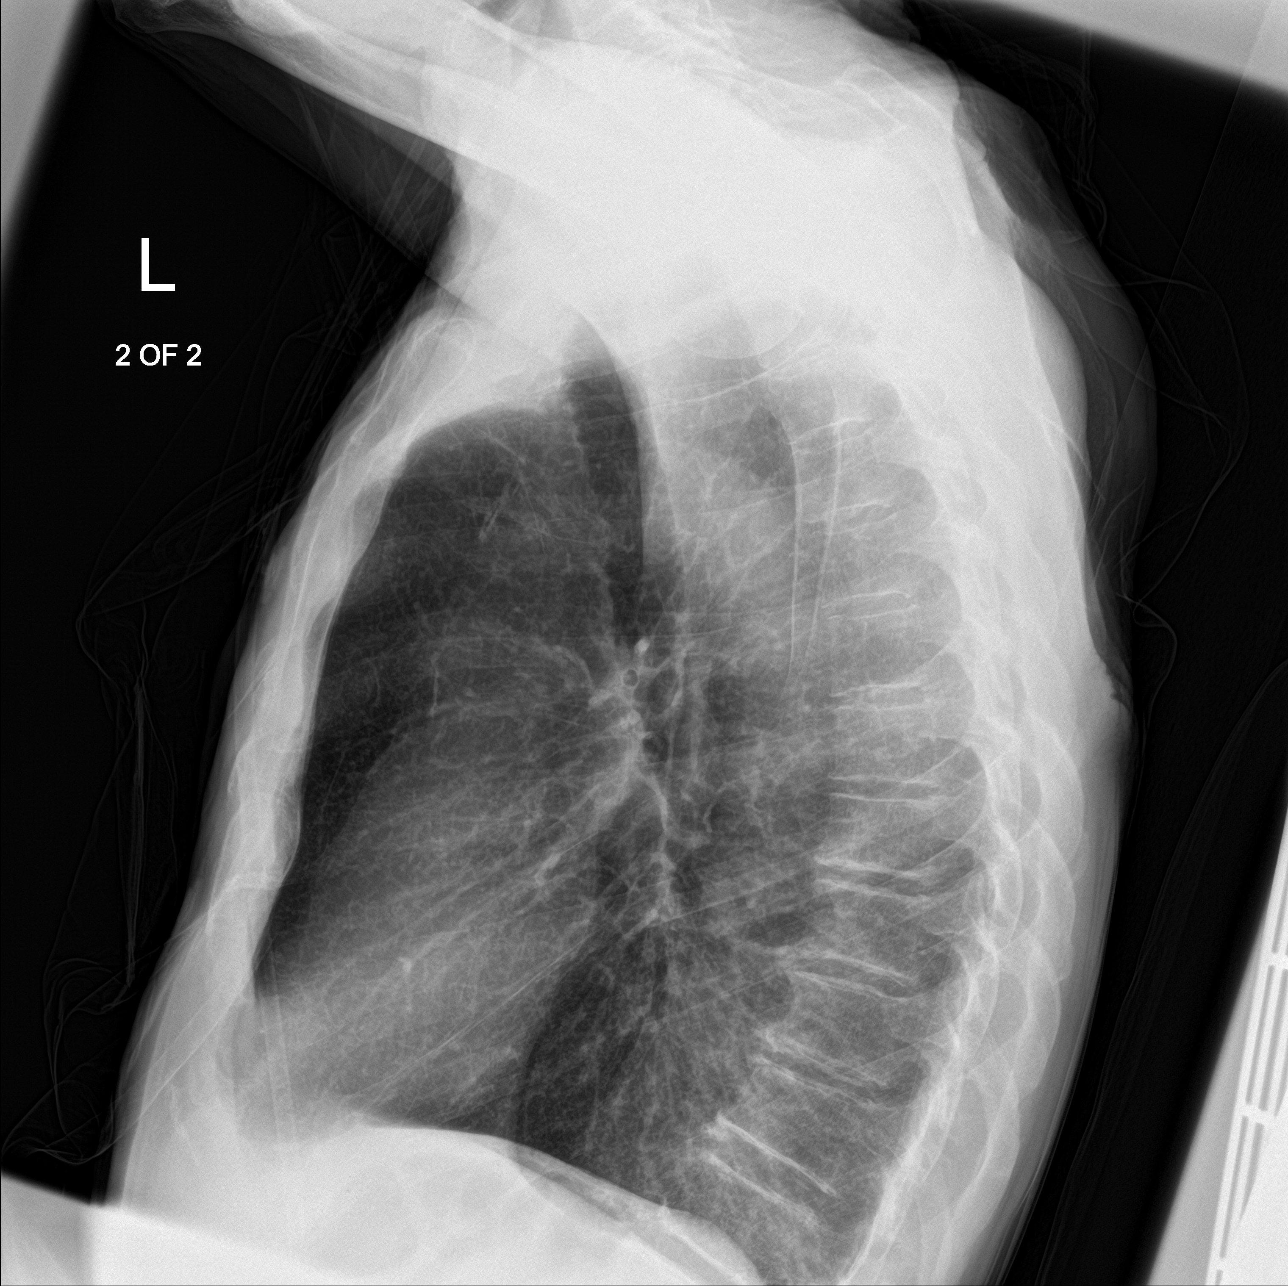
[im 2/2]
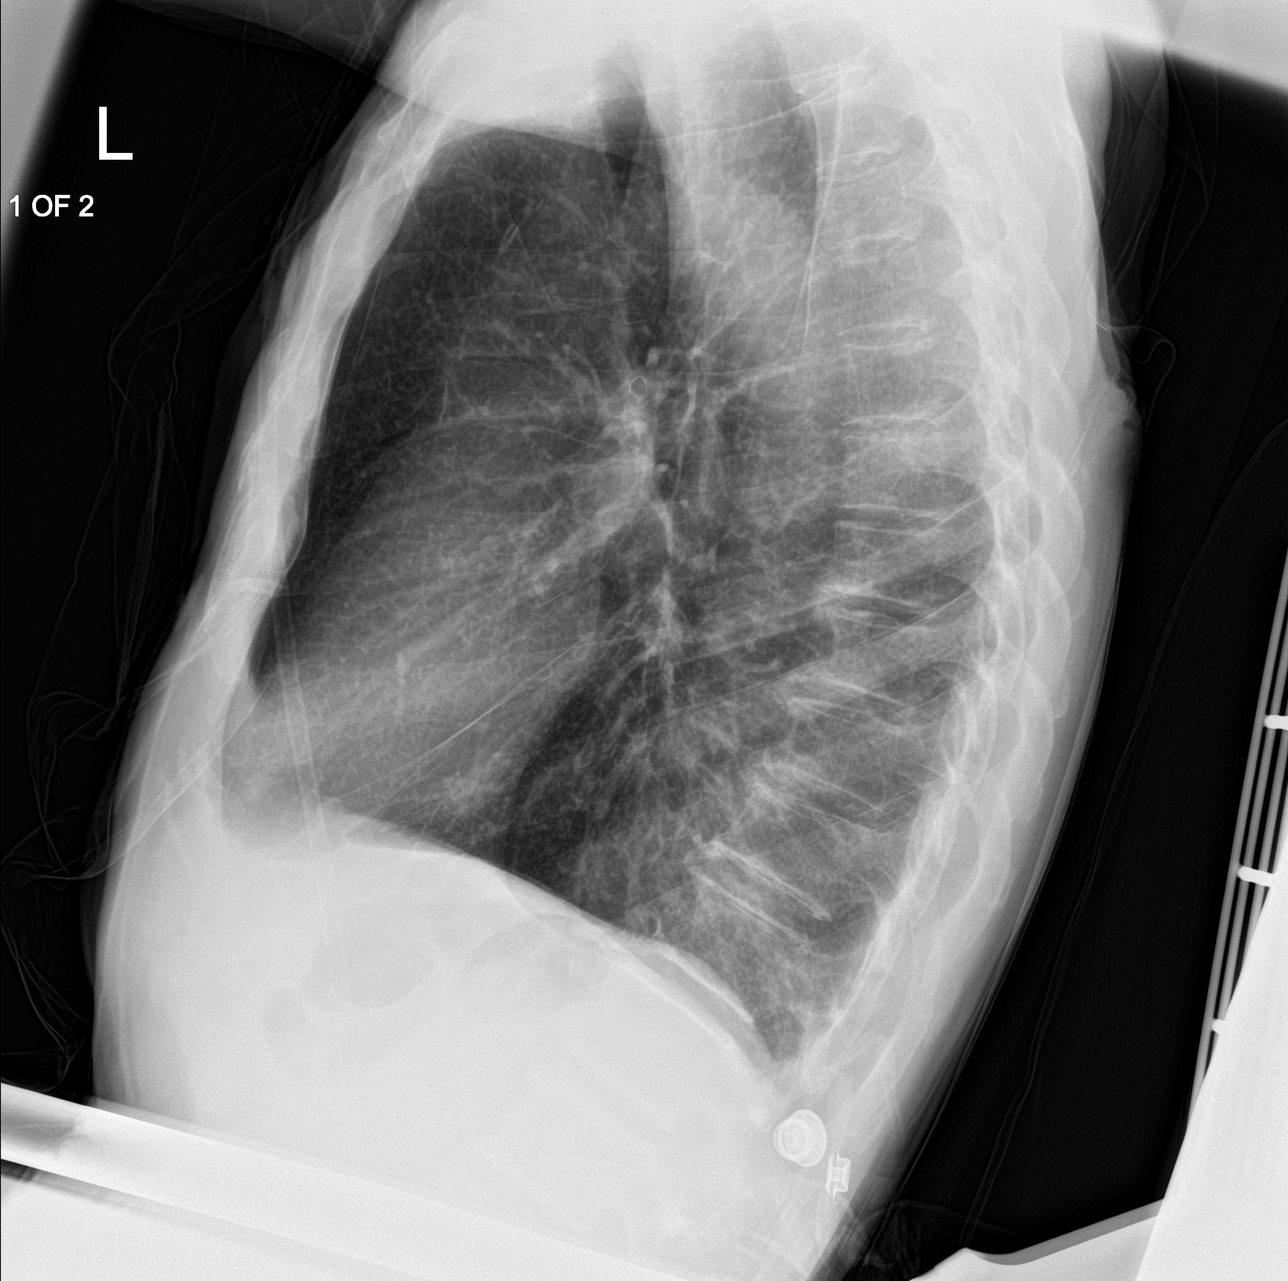

[3 of 3 positions shown; findings below may reference images not displayed]

FINDINGS: Cardiac shadow is within normal limits. Lungs are hyperinflated
without focal infiltrate. Old healed rib fractures are noted. No
acute bony abnormality is seen.
IMPRESSION: COPD without acute abnormality.

## 2022-08-27 ENCOUNTER — Emergency Department (HOSPITAL_COMMUNITY)
Admission: EM | Admit: 2022-08-27 | Discharge: 2022-08-27 | Disposition: A | Discharge status: Home / Self Care | Payer: Medicaid Other | Attending: Emergency Medicine | Admitting: Emergency Medicine

## 2022-08-27 ENCOUNTER — Emergency Department (HOSPITAL_COMMUNITY): Payer: Medicaid Other

## 2022-08-27 ENCOUNTER — Other Ambulatory Visit: Payer: Self-pay

## 2022-08-27 ENCOUNTER — Encounter (HOSPITAL_COMMUNITY): Payer: Self-pay

## 2022-08-27 DIAGNOSIS — M79639 Pain in unspecified forearm: Secondary | ICD-10-CM | POA: Diagnosis present

## 2022-08-27 DIAGNOSIS — S0990XA Unspecified injury of head, initial encounter: Secondary | ICD-10-CM | POA: Insufficient documentation

## 2022-08-27 DIAGNOSIS — Z0471 Encounter for examination and observation following alleged adult physical abuse: Secondary | ICD-10-CM | POA: Diagnosis not present

## 2022-08-27 DIAGNOSIS — S51802A Unspecified open wound of left forearm, initial encounter: Secondary | ICD-10-CM | POA: Insufficient documentation

## 2022-08-27 DIAGNOSIS — S81002A Unspecified open wound, left knee, initial encounter: Secondary | ICD-10-CM | POA: Insufficient documentation

## 2022-08-27 DIAGNOSIS — J449 Chronic obstructive pulmonary disease, unspecified: Secondary | ICD-10-CM | POA: Insufficient documentation

## 2022-08-27 DIAGNOSIS — S59909A Unspecified injury of unspecified elbow, initial encounter: Secondary | ICD-10-CM | POA: Diagnosis not present

## 2022-08-27 DIAGNOSIS — R220 Localized swelling, mass and lump, head: Secondary | ICD-10-CM | POA: Diagnosis not present

## 2022-08-27 DIAGNOSIS — Z87891 Personal history of nicotine dependence: Secondary | ICD-10-CM | POA: Diagnosis not present

## 2022-08-27 DIAGNOSIS — S51801A Unspecified open wound of right forearm, initial encounter: Secondary | ICD-10-CM | POA: Insufficient documentation

## 2022-08-27 DIAGNOSIS — M25562 Pain in left knee: Secondary | ICD-10-CM | POA: Diagnosis not present

## 2022-08-27 DIAGNOSIS — S80919A Unspecified superficial injury of unspecified knee, initial encounter: Secondary | ICD-10-CM | POA: Diagnosis not present

## 2022-08-27 MED ORDER — BACITRACIN ZINC 500 UNIT/GM EX OINT
TOPICAL_OINTMENT | CUTANEOUS | Status: AC
  Filled 2022-08-27: qty 0.9

## 2022-08-27 MED ORDER — ACETAMINOPHEN 325 MG PO TABS
650.0000 mg | ORAL_TABLET | Freq: Once | ORAL | Status: AC
  Administered 2022-08-27: 650 mg via ORAL
  Filled 2022-08-27: qty 2

## 2022-08-27 MED ORDER — BACITRACIN ZINC 500 UNIT/GM EX OINT: TOPICAL_OINTMENT | Freq: Two times a day (BID) | CUTANEOUS | Status: DC

## 2022-08-27 NOTE — Discharge Instructions (Addendum)
As we discussed, your workup in the ER today was reassuring for acute findings.  CT and x-ray imaging did not reveal any emergent concerns.  We have cleaned and dressed your wounds and given you ace wrap to wear for your leg pain. I have also given you a referral to orthopedics with a number to call to schedule an appointment for follow-up. I recommend that you rest, ice, compress, and elevate areas of discomfort and take Tylenol/ibuprofen as needed for pain.  You may also have a concussion.  I recommend that you get plenty of rest, take Tylenol/ibuprofen, and avoid bright lights, loud, and contact sports while you are having symptoms.  Return if development of any new or worsening symptoms.
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
## 2024-03-08 ENCOUNTER — Other Ambulatory Visit: Payer: Self-pay

## 2024-03-08 ENCOUNTER — Emergency Department (HOSPITAL_COMMUNITY): Payer: MEDICAID

## 2024-03-08 ENCOUNTER — Emergency Department (HOSPITAL_COMMUNITY): Payer: Self-pay

## 2024-03-08 ENCOUNTER — Other Ambulatory Visit (HOSPITAL_COMMUNITY): Payer: Self-pay

## 2024-03-08 ENCOUNTER — Encounter (HOSPITAL_COMMUNITY): Payer: Self-pay | Admitting: Internal Medicine

## 2024-03-08 ENCOUNTER — Observation Stay (HOSPITAL_COMMUNITY)
Admission: EM | Admit: 2024-03-08 | Discharge: 2024-03-14 | DRG: 189 | Disposition: A | Payer: MEDICAID | Attending: Orthopedic Surgery | Admitting: Orthopedic Surgery

## 2024-03-08 DIAGNOSIS — Z801 Family history of malignant neoplasm of trachea, bronchus and lung: Secondary | ICD-10-CM

## 2024-03-08 DIAGNOSIS — Z79899 Other long term (current) drug therapy: Secondary | ICD-10-CM

## 2024-03-08 DIAGNOSIS — Z79890 Hormone replacement therapy (postmenopausal): Secondary | ICD-10-CM

## 2024-03-08 DIAGNOSIS — R112 Nausea with vomiting, unspecified: Secondary | ICD-10-CM | POA: Diagnosis present

## 2024-03-08 DIAGNOSIS — M47814 Spondylosis without myelopathy or radiculopathy, thoracic region: Secondary | ICD-10-CM | POA: Diagnosis present

## 2024-03-08 DIAGNOSIS — Z5900 Homelessness unspecified: Secondary | ICD-10-CM

## 2024-03-08 DIAGNOSIS — J441 Chronic obstructive pulmonary disease with (acute) exacerbation: Principal | ICD-10-CM | POA: Diagnosis present

## 2024-03-08 DIAGNOSIS — Z604 Social exclusion and rejection: Secondary | ICD-10-CM | POA: Diagnosis present

## 2024-03-08 DIAGNOSIS — K2950 Unspecified chronic gastritis without bleeding: Secondary | ICD-10-CM | POA: Diagnosis present

## 2024-03-08 DIAGNOSIS — R238 Other skin changes: Secondary | ICD-10-CM | POA: Diagnosis present

## 2024-03-08 DIAGNOSIS — J9601 Acute respiratory failure with hypoxia: Secondary | ICD-10-CM | POA: Diagnosis present

## 2024-03-08 DIAGNOSIS — R1310 Dysphagia, unspecified: Secondary | ICD-10-CM

## 2024-03-08 DIAGNOSIS — Z5941 Food insecurity: Secondary | ICD-10-CM

## 2024-03-08 DIAGNOSIS — E039 Hypothyroidism, unspecified: Secondary | ICD-10-CM | POA: Diagnosis present

## 2024-03-08 DIAGNOSIS — F17210 Nicotine dependence, cigarettes, uncomplicated: Secondary | ICD-10-CM | POA: Diagnosis present

## 2024-03-08 DIAGNOSIS — Z1152 Encounter for screening for COVID-19: Secondary | ICD-10-CM

## 2024-03-08 DIAGNOSIS — T363X5A Adverse effect of macrolides, initial encounter: Secondary | ICD-10-CM | POA: Diagnosis present

## 2024-03-08 DIAGNOSIS — Z5982 Transportation insecurity: Secondary | ICD-10-CM

## 2024-03-08 DIAGNOSIS — I700 Atherosclerosis of aorta: Secondary | ICD-10-CM | POA: Diagnosis present

## 2024-03-08 DIAGNOSIS — J439 Emphysema, unspecified: Secondary | ICD-10-CM | POA: Diagnosis present

## 2024-03-08 DIAGNOSIS — K4090 Unilateral inguinal hernia, without obstruction or gangrene, not specified as recurrent: Secondary | ICD-10-CM | POA: Diagnosis present

## 2024-03-08 DIAGNOSIS — Z716 Tobacco abuse counseling: Secondary | ICD-10-CM

## 2024-03-08 DIAGNOSIS — D72829 Elevated white blood cell count, unspecified: Secondary | ICD-10-CM | POA: Diagnosis present

## 2024-03-08 LAB — CBC
HCT: 39 % (ref 39.0–52.0)
Hemoglobin: 13.3 g/dL (ref 13.0–17.0)
MCH: 32.2 pg (ref 26.0–34.0)
MCHC: 34.1 g/dL (ref 30.0–36.0)
MCV: 94.4 fL (ref 80.0–100.0)
Platelets: 290 K/uL (ref 150–400)
RBC: 4.13 MIL/uL — ABNORMAL LOW (ref 4.22–5.81)
RDW: 13 % (ref 11.5–15.5)
WBC: 18.8 K/uL — ABNORMAL HIGH (ref 4.0–10.5)
nRBC: 0 % (ref 0.0–0.2)

## 2024-03-08 LAB — RESP PANEL BY RT-PCR (RSV, FLU A&B, COVID)  RVPGX2
Influenza A by PCR: NEGATIVE
Influenza B by PCR: NEGATIVE
Resp Syncytial Virus by PCR: NEGATIVE
SARS Coronavirus 2 by RT PCR: NEGATIVE

## 2024-03-08 LAB — TROPONIN T, HIGH SENSITIVITY
Troponin T High Sensitivity: 17 ng/L (ref 0–19)
Troponin T High Sensitivity: 15 ng/L (ref 0–19)

## 2024-03-08 LAB — BASIC METABOLIC PANEL WITH GFR
Anion gap: 13 (ref 5–15)
BUN: 35 mg/dL — ABNORMAL HIGH (ref 8–23)
CO2: 22 mmol/L (ref 22–32)
Calcium: 9.3 mg/dL (ref 8.9–10.3)
Chloride: 98 mmol/L (ref 98–111)
Creatinine, Ser: 1.15 mg/dL (ref 0.61–1.24)
GFR, Estimated: >60 mL/min (ref >60)
Glucose, Bld: 104 mg/dL — ABNORMAL HIGH (ref 70–99)
Potassium: 4.1 mmol/L (ref 3.5–5.1)
Sodium: 134 mmol/L — ABNORMAL LOW (ref 135–145)

## 2024-03-08 LAB — TSH: TSH: 28.7 u[IU]/mL — ABNORMAL HIGH (ref 0.350–4.500)

## 2024-03-08 MED ORDER — ACETAMINOPHEN 325 MG PO TABS
650.0000 mg | ORAL_TABLET | Freq: Four times a day (QID) | ORAL | Status: DC | PRN
  Administered 2024-03-10 – 2024-03-14 (×3): 650 mg via ORAL
  Filled 2024-03-08 (×3): qty 2

## 2024-03-08 MED ORDER — PNEUMOCOCCAL 20-VAL CONJ VACC 0.5 ML IM SUSY
0.5000 mL | PREFILLED_SYRINGE | INTRAMUSCULAR | Status: DC
  Filled 2024-03-08: qty 0.5

## 2024-03-08 MED ORDER — IPRATROPIUM-ALBUTEROL 0.5-2.5 (3) MG/3ML IN SOLN
3.0000 mL | Freq: Four times a day (QID) | RESPIRATORY_TRACT | Status: DC
  Administered 2024-03-08 – 2024-03-12 (×16): 3 mL via RESPIRATORY_TRACT
  Filled 2024-03-08 (×16): qty 3

## 2024-03-08 MED ORDER — TRAZODONE HCL 50 MG PO TABS
25.0000 mg | ORAL_TABLET | Freq: Every evening | ORAL | Status: DC | PRN
  Administered 2024-03-10 – 2024-03-11 (×2): 25 mg via ORAL
  Filled 2024-03-08 (×2): qty 1

## 2024-03-08 MED ORDER — PREDNISONE 20 MG PO TABS
40.0000 mg | ORAL_TABLET | Freq: Every day | ORAL | Status: DC
  Administered 2024-03-09 – 2024-03-14 (×6): 40 mg via ORAL
  Filled 2024-03-08 (×6): qty 2

## 2024-03-08 MED ORDER — ONDANSETRON HCL 4 MG/2ML IJ SOLN
4.0000 mg | Freq: Four times a day (QID) | INTRAMUSCULAR | Status: DC | PRN
  Administered 2024-03-11 – 2024-03-13 (×2): 4 mg via INTRAVENOUS
  Filled 2024-03-08 (×2): qty 2

## 2024-03-08 MED ORDER — ALBUTEROL SULFATE (2.5 MG/3ML) 0.083% IN NEBU
2.5000 mg | INHALATION_SOLUTION | RESPIRATORY_TRACT | Status: DC | PRN
  Administered 2024-03-09 – 2024-03-10 (×2): 2.5 mg via RESPIRATORY_TRACT
  Filled 2024-03-08 (×2): qty 3

## 2024-03-08 MED ORDER — ONDANSETRON HCL 4 MG PO TABS: 4.0000 mg | ORAL_TABLET | Freq: Four times a day (QID) | ORAL | Status: DC | PRN

## 2024-03-08 MED ORDER — ACETAMINOPHEN 650 MG RE SUPP: 650.0000 mg | Freq: Four times a day (QID) | RECTAL | Status: DC | PRN

## 2024-03-08 MED ORDER — SODIUM CHLORIDE 0.9 % IV SOLN
500.0000 mg | Freq: Once | INTRAVENOUS | Status: DC
  Filled 2024-03-08: qty 5

## 2024-03-08 MED ORDER — ALBUTEROL SULFATE (2.5 MG/3ML) 0.083% IN NEBU
10.0000 mg/h | INHALATION_SOLUTION | Freq: Once | RESPIRATORY_TRACT | Status: AC
  Administered 2024-03-08: 10 mg/h via RESPIRATORY_TRACT
  Filled 2024-03-08: qty 12

## 2024-03-08 MED ORDER — PREDNISONE 20 MG PO TABS
60.0000 mg | ORAL_TABLET | Freq: Once | ORAL | Status: AC
  Administered 2024-03-08: 60 mg via ORAL
  Filled 2024-03-08: qty 3

## 2024-03-08 MED ORDER — INFLUENZA VAC SPLIT HIGH-DOSE 0.5 ML IM SUSY
0.5000 mL | PREFILLED_SYRINGE | INTRAMUSCULAR | Status: DC
  Filled 2024-03-08: qty 0.5

## 2024-03-08 MED ORDER — SODIUM CHLORIDE 0.9 % IV SOLN
1.0000 g | Freq: Once | INTRAVENOUS | Status: AC
  Administered 2024-03-08: 1 g via INTRAVENOUS
  Filled 2024-03-08: qty 10

## 2024-03-08 MED ORDER — SODIUM CHLORIDE 0.9 % IV SOLN
500.0000 mg | INTRAVENOUS | Status: DC
  Administered 2024-03-08: 500 mg via INTRAVENOUS
  Filled 2024-03-08: qty 5

## 2024-03-08 MED ORDER — IPRATROPIUM-ALBUTEROL 0.5-2.5 (3) MG/3ML IN SOLN
3.0000 mL | Freq: Once | RESPIRATORY_TRACT | Status: AC
  Administered 2024-03-08: 3 mL via RESPIRATORY_TRACT
  Filled 2024-03-08: qty 3

## 2024-03-08 MED ORDER — ENOXAPARIN SODIUM 40 MG/0.4ML IJ SOSY
40.0000 mg | PREFILLED_SYRINGE | INTRAMUSCULAR | Status: DC
  Administered 2024-03-08 – 2024-03-14 (×6): 40 mg via SUBCUTANEOUS
  Filled 2024-03-08 (×6): qty 0.4
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
## 2024-03-08 NOTE — Plan of Care (Signed)
?  Problem: Clinical Measurements: ?Goal: Respiratory complications will improve ?Outcome: Progressing ?Goal: Cardiovascular complication will be avoided ?Outcome: Progressing ?  ?Problem: Activity: ?Goal: Risk for activity intolerance will decrease ?Outcome: Progressing ?  ?Problem: Nutrition: ?Goal: Adequate nutrition will be maintained ?Outcome: Progressing ?  ?Problem: Coping: ?Goal: Level of anxiety will decrease ?Outcome: Progressing ?  ?Problem: Safety: ?Goal: Ability to remain free from injury will improve ?Outcome: Progressing ?  ?

## 2024-03-08 NOTE — H&P (Signed)
 History and Physical  Jimmy [name] FMW:[id number] DOB: 1955/10/19 DOA: 03/08/2024  PCP: Pcp, No   Chief Complaint: Trouble breathing, cough  HPI: Jimmy [name] is a 68 y.o. male with medical history significant for homelessness, hypothyroidism and emphysema being admitted to the hospital with cough likely due to acute exacerbation of COPD.  Patient states he was in his usual state of health until last night, when late in the evening he had sudden onset of severe coughing spells, productive of thick sputum.  He denies any fevers, or chest pain.  He is not aware of a history of COPD or emphysema.  He does still smoke.  Workup in the emergency department as detailed below shows no evidence of acute infection, he has been treated with steroids and breathing treatments and says that he is only slightly better.  On further review of systems, patient states that he often chokes and coughs when eating and feels like he is going to regurgitate his food.  Review of Systems: Please see HPI for pertinent positives and negatives. A complete 10 system review of systems are otherwise negative.  No past medical history on file. Past Surgical History:  Procedure Laterality Date   APPENDECTOMY     Social History:  reports that he has been smoking cigarettes. He has never used smokeless tobacco. He reports that he does not drink alcohol and does not use drugs.  No Known Allergies  Family History  Problem Relation Age of Onset   CAD Neg Hx      Prior to Admission medications   Medication Sig Start Date End Date Taking? Authorizing Provider  albuterol  (VENTOLIN  HFA) 108 (90 Base) MCG/ACT inhaler Inhale 2 puffs into the lungs every 4 (four) hours as needed for wheezing or shortness of breath. Patient not taking: Reported on 04/19/2022 03/15/22   Kenard Zachary PARAS, MD  levothyroxine  (SYNTHROID ) 50 MCG tablet Take 1 tablet (50 mcg total) by mouth daily at 6 (six) AM. Patient not taking: Reported on 04/19/2022  03/16/22   Kenard Zachary PARAS, MD    Physical Exam: BP 111/65   Pulse (!) 108   Temp 98.2 F (36.8 C)   Resp (!) 25   Ht 5' 2 (1.575 m)   Wt 53.5 kg   SpO2 96%   BMI 21.58 kg/m  General:  Alert, oriented, calm, in no acute distress, able to speak in full sentences.  Currently wearing nasal cannula. Eyes: EOMI, clear conjuctivae, white sclerea Neck: supple, no masses, trachea mildline  Cardiovascular: RRR, no murmurs or rubs, no peripheral edema  Respiratory: clear to auscultation bilaterally, no wheezes, no crackles  Abdomen: soft, nontender, nondistended, normal bowel tones heard  Skin: dry, no rashes  Musculoskeletal: no joint effusions, normal range of motion  Psychiatric: appropriate affect, normal speech  Neurologic: extraocular muscles intact, clear speech, moving all extremities with intact sensorium         Labs on Admission:  Basic Metabolic Panel: Recent Labs  Lab 03/08/24 0611  NA 134*  K 4.1  CL 98  CO2 22  GLUCOSE 104*  BUN 35*  CREATININE 1.15  CALCIUM 9.3   Liver Function Tests: No results for input(s): AST, ALT, ALKPHOS, BILITOT, PROT, ALBUMIN in the last 168 hours. No results for input(s): LIPASE, AMYLASE in the last 168 hours. No results for input(s): AMMONIA in the last 168 hours. CBC: Recent Labs  Lab 03/08/24 0611  WBC 18.8*  HGB 13.3  HCT 39.0  MCV 94.4  PLT 290  Cardiac Enzymes: No results for input(s): CKTOTAL, CKMB, CKMBINDEX, TROPONINI in the last 168 hours. BNP (last 3 results) No results for input(s): BNP in the last 8760 hours.  ProBNP (last 3 results) No results for input(s): PROBNP in the last 8760 hours.  CBG: No results for input(s): GLUCAP in the last 168 hours.  Radiological Exams on Admission: DG Chest 2 View Result Date: 03/08/2024 EXAM: 2 VIEW(S) XRAY OF THE CHEST 03/08/2024 04:02:00 AM COMPARISON: None available. CLINICAL HISTORY: cough FINDINGS: LUNGS AND PLEURA: Hyperinflated  and hyperlucent lungs, suggestive of underlying COPD. No focal pulmonary opacity. No pleural effusion. No pneumothorax. HEART AND MEDIASTINUM: No acute abnormality of the cardiac and mediastinal silhouettes. BONES AND SOFT TISSUES: Multilevel degenerative changes of thoracic spine. IMPRESSION: 1. Hyperinflated and hyperlucent lungs, suggestive of underlying COPD. 2. 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 Electronically signed by: Dorethia Molt MD 03/08/2024 04:20 AM EST RP Workstation: [id number]   Assessment/Plan Jimmy [name] is a 68 y.o. male with medical history significant for homelessness, hypothyroidism and emphysema being admitted to the hospital with cough likely due to acute exacerbation of COPD.   Acute hypoxic respiratory failure, acute exacerbation of COPD-patient on no oxygen at baseline, not on any scheduled medications.  He does have a known history of emphysema, and was hospitalized previously with acute exacerbation of COPD. -Observation admission -Scheduled DuoNebs, as needed albuterol  -Incentive spirometer, flutter valve -Continue oral prednisone  -Empiric IV azithromycin   Leukocytosis-possibly reactive, no clear evidence of bacterial or viral infection, will trend  Dysphagia-patient describes a several month history of choking sensation intermittently with p.o. intake. -SLP evaluation  Hypothyroidism-not on any current medications, check TSH  DVT prophylaxis: Lovenox      Code Status: Full Code  Consults called: None  Admission status: Observation  Time spent: 49 minutes  Myliah Medel CHRISTELLA Gail MD [facility] Pager [phone]  If 7PM-7AM, please contact night-coverage [email or web address] Password [email or web address]  03/08/2024, 8:46 AM

## 2024-03-08 NOTE — ED Provider Notes (Signed)
 [place] EMERGENCY DEPARTMENT AT [facility] Provider Note   CSN: [id number] Arrival date & time: 03/08/24  9694     Patient presents with: Shortness of Breath   Jimmy [name] is a 68 y.o. male.   C62-year-old male with past medical history of COPD presents with complaint of cough, shortness of breath and chest pain.  He states his cough was nonproductive until recently when he has been coughing up very thick mucus.  He denies fevers or chills.  He states he had some pain in his chest earlier this evening with the coughing.       Prior to Admission medications   Medication Sig Start Date End Date Taking? Authorizing Provider  albuterol  (VENTOLIN  HFA) 108 (90 Base) MCG/ACT inhaler Inhale 2 puffs into the lungs every 4 (four) hours as needed for wheezing or shortness of breath. Patient not taking: Reported on 04/19/2022 03/15/22   Kenard Zachary PARAS, MD  levothyroxine  (SYNTHROID ) 50 MCG tablet Take 1 tablet (50 mcg total) by mouth daily at 6 (six) AM. Patient not taking: Reported on 04/19/2022 03/16/22   Kenard Zachary PARAS, MD    Allergies: Patient has no known allergies.    Review of Systems Negative except as per Updated Vital Signs BP 126/68   Pulse (!) 110   Temp 98.2 F (36.8 C)   Resp (!) 22   Ht 5' 2 (1.575 m)   Wt 53.5 kg   SpO2 94%   BMI 21.58 kg/m   Physical Exam Vitals and nursing note reviewed.  Constitutional:      General: He is not in acute distress.    Appearance: He is well-developed. He is not diaphoretic.  HENT:     Head: Normocephalic and atraumatic.  Cardiovascular:     Rate and Rhythm: Regular rhythm. Tachycardia present.  Pulmonary:     Effort: Pulmonary effort is normal.     Breath sounds: Examination of the right-lower field reveals decreased breath sounds. Examination of the left-lower field reveals decreased breath sounds. Decreased breath sounds present. No wheezing, rhonchi or rales.  Chest:     Chest wall: No tenderness.   Musculoskeletal:     Right lower leg: No tenderness. No edema.     Left lower leg: No tenderness. No edema.  Skin:    General: Skin is warm and dry.  Neurological:     Mental Status: He is alert and oriented to person, place, and time.  Psychiatric:        Behavior: Behavior normal.     (all labs ordered are listed, but only abnormal results are displayed) Labs Reviewed  CBC - Abnormal; Notable for the following components:      Result Value   WBC 18.8 (*)    RBC 4.13 (*)    All other components within normal limits  RESP PANEL BY RT-PCR (RSV, FLU A&B, COVID)  RVPGX2  BASIC METABOLIC PANEL WITH GFR  TROPONIN T, HIGH SENSITIVITY    EKG: None  Radiology: DG Chest 2 View Result Date: 03/08/2024 EXAM: 2 VIEW(S) XRAY OF THE CHEST 03/08/2024 04:02:00 AM COMPARISON: None available. CLINICAL HISTORY: cough FINDINGS: LUNGS AND PLEURA: Hyperinflated and hyperlucent lungs, suggestive of underlying COPD. No focal pulmonary opacity. No pleural effusion. No pneumothorax. HEART AND MEDIASTINUM: No acute abnormality of the cardiac and mediastinal silhouettes. BONES AND SOFT TISSUES: Multilevel degenerative changes of thoracic spine. IMPRESSION: 1. Hyperinflated and hyperlucent lungs, suggestive of underlying COPD. 2. 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 Electronically  signed by: Dorethia Molt MD 03/08/2024 04:20 AM EST RP Workstation: [id number]     Procedures   Medications Ordered in the ED  ipratropium-albuterol  (DUONEB) 0.5-2.5 (3) MG/3ML nebulizer solution 3 mL (3 mLs Nebulization Given 03/08/24 0603)  predniSONE  (DELTASONE ) tablet 60 mg (60 mg Oral Given 03/08/24 0603)                                    Medical Decision Making Amount and/or Complexity of Data Reviewed Labs: ordered. Radiology: ordered.  Risk Prescription drug management.   This patient presents to the ED for concern of SHOB, cough, CP, this involves an extensive number of treatment options,  and is a complaint that carries with it a high risk of complications and morbidity.  The differential diagnosis includes ACS, COPD exacerbation, pneumonia, viral illness, PE   Co morbidities / Chronic conditions that complicate the patient evaluation  Tobacco use, COPD   Additional history obtained:  Additional history obtained from EMR External records from outside source obtained and reviewed including prior labs and imaging    Lab Tests:  I Ordered, and personally interpreted labs.  The pertinent results include:  WBC with elevated WBC at 18.8. BMP pending. Covid/flu/rsv negative. Troponin pending.    Imaging Studies ordered:  I ordered imaging studies including chest x-ray I independently visualized and interpreted imaging which showed hyperinflated I agree with the radiologist interpretation   Cardiac Monitoring: / EKG:  The patient was maintained on a cardiac monitor.  I personally viewed and interpreted the cardiac monitored which showed an underlying rhythm of: Sinus tachycardia, rate 112   Problem List / ED Course / Critical interventions / Medication management  68 year old male presents with complaint of shortness of breath with cough, reportedly progressive.  Patient has history of COPD, is a smoker.  He denies fevers.  He appears uncomfortable in the room.  States he has had chest pain.  Workup initiated, chest x-ray hyperinflated, provided with DuoNeb and prednisone .  Troponin pending at time of signout to oncoming provider.  May need admission for COPD exacerbation versus discharge with possible antibiotics, steroids, inhaler. I ordered medication including DuoNeb, prednisone  I have reviewed the patients home medicines and have made adjustments as needed   Social Determinants of Health:  unhoused   Test / Admission - Considered:  Disposition pending at time of signout      Final diagnoses:  None    ED Discharge Orders     None           Beverley Leita DELENA DEVONNA 03/08/24 [id number]    Griselda Norris, MD 03/09/24 [phone]

## 2024-03-08 NOTE — ED Triage Notes (Signed)
 Patient brought in via EMS with c/o nonproductive cough that causing him shortness of breath. Patient alert and oriented x4. Vital signs stable with EMS

## 2024-03-09 DIAGNOSIS — R1310 Dysphagia, unspecified: Secondary | ICD-10-CM

## 2024-03-09 DIAGNOSIS — D72829 Elevated white blood cell count, unspecified: Secondary | ICD-10-CM | POA: Diagnosis present

## 2024-03-09 DIAGNOSIS — J9601 Acute respiratory failure with hypoxia: Principal | ICD-10-CM

## 2024-03-09 LAB — CBC
HCT: 38.3 % — ABNORMAL LOW (ref 39.0–52.0)
Hemoglobin: 12.7 g/dL — ABNORMAL LOW (ref 13.0–17.0)
MCH: 31.9 pg (ref 26.0–34.0)
MCHC: 33.2 g/dL (ref 30.0–36.0)
MCV: 96.2 fL (ref 80.0–100.0)
Platelets: 288 K/uL (ref 150–400)
RBC: 3.98 MIL/uL — ABNORMAL LOW (ref 4.22–5.81)
RDW: 13 % (ref 11.5–15.5)
WBC: 13 K/uL — ABNORMAL HIGH (ref 4.0–10.5)
nRBC: 0 % (ref 0.0–0.2)

## 2024-03-09 LAB — BASIC METABOLIC PANEL WITH GFR
Anion gap: 10 (ref 5–15)
BUN: 34 mg/dL — ABNORMAL HIGH (ref 8–23)
CO2: 24 mmol/L (ref 22–32)
Calcium: 9.4 mg/dL (ref 8.9–10.3)
Chloride: 103 mmol/L (ref 98–111)
Creatinine, Ser: 0.95 mg/dL (ref 0.61–1.24)
GFR, Estimated: >60 mL/min (ref >60)
Glucose, Bld: 110 mg/dL — ABNORMAL HIGH (ref 70–99)
Potassium: 3.6 mmol/L (ref 3.5–5.1)
Sodium: 137 mmol/L (ref 135–145)

## 2024-03-09 LAB — HIV ANTIBODY (ROUTINE TESTING W REFLEX): HIV Screen 4th Generation wRfx: NONREACTIVE

## 2024-03-09 MED ORDER — LORATADINE 10 MG PO TABS
10.0000 mg | ORAL_TABLET | Freq: Every day | ORAL | Status: DC
  Administered 2024-03-09 – 2024-03-14 (×6): 10 mg via ORAL
  Filled 2024-03-09 (×6): qty 1

## 2024-03-09 MED ORDER — DOXYCYCLINE HYCLATE 100 MG PO TABS: 100.0000 mg | ORAL_TABLET | Freq: Two times a day (BID) | ORAL | Status: DC

## 2024-03-09 MED ORDER — PHENOL 1.4 % MT LIQD
1.0000 | OROMUCOSAL | Status: DC | PRN
  Administered 2024-03-09 – 2024-03-13 (×6): 1 via OROMUCOSAL
  Filled 2024-03-09: qty 177

## 2024-03-09 MED ORDER — LEVOTHYROXINE SODIUM 50 MCG PO TABS
50.0000 ug | ORAL_TABLET | Freq: Every day | ORAL | Status: AC
  Administered 2024-03-09 – 2024-03-14 (×6): 50 ug via ORAL
  Filled 2024-03-09 (×6): qty 1

## 2024-03-09 MED ORDER — DIPHENHYDRAMINE HCL 25 MG PO CAPS
25.0000 mg | ORAL_CAPSULE | Freq: Once | ORAL | Status: AC | PRN
  Administered 2024-03-09: 25 mg via ORAL
  Filled 2024-03-09: qty 1

## 2024-03-09 MED ORDER — NICOTINE 7 MG/24HR TD PT24
7.0000 mg | MEDICATED_PATCH | Freq: Every day | TRANSDERMAL | Status: AC
  Administered 2024-03-09 – 2024-03-10 (×2): 7 mg via TRANSDERMAL
  Filled 2024-03-09 (×2): qty 1

## 2024-03-09 MED ORDER — MAGIC MOUTHWASH W/LIDOCAINE
10.0000 mL | Freq: Once | ORAL | Status: AC | PRN
  Administered 2024-03-09: 10 mL via ORAL
  Filled 2024-03-09: qty 10

## 2024-03-09 MED ORDER — BENZONATATE 100 MG PO CAPS
100.0000 mg | ORAL_CAPSULE | Freq: Once | ORAL | Status: AC
  Administered 2024-03-09: 100 mg via ORAL
  Filled 2024-03-09: qty 1

## 2024-03-09 MED ORDER — GUAIFENESIN ER 600 MG PO TB12
1200.0000 mg | ORAL_TABLET | Freq: Two times a day (BID) | ORAL | Status: DC
  Administered 2024-03-09 – 2024-03-14 (×11): 1200 mg via ORAL
  Filled 2024-03-09 (×11): qty 2

## 2024-03-09 MED ORDER — FAMOTIDINE IN NACL 20-0.9 MG/50ML-% IV SOLN
20.0000 mg | Freq: Two times a day (BID) | INTRAVENOUS | Status: DC
  Administered 2024-03-09 – 2024-03-10 (×3): 20 mg via INTRAVENOUS
  Filled 2024-03-09 (×3): qty 50

## 2024-03-09 MED ORDER — GUAIFENESIN-DM 100-10 MG/5ML PO SYRP
5.0000 mL | ORAL_SOLUTION | ORAL | Status: DC | PRN
  Administered 2024-03-09 – 2024-03-12 (×3): 5 mL via ORAL
  Filled 2024-03-09 (×4): qty 5

## 2024-03-09 MED ORDER — PANTOPRAZOLE SODIUM 40 MG PO TBEC
40.0000 mg | DELAYED_RELEASE_TABLET | Freq: Every day | ORAL | Status: DC
  Administered 2024-03-09 – 2024-03-11 (×3): 40 mg via ORAL
  Filled 2024-03-09 (×3): qty 1

## 2024-03-09 MED ORDER — SODIUM CHLORIDE 0.9 % IV SOLN
2.0000 g | INTRAVENOUS | Status: AC
  Administered 2024-03-09 – 2024-03-12 (×4): 2 g via INTRAVENOUS
  Filled 2024-03-09 (×4): qty 20

## 2024-03-09 MED ORDER — BUDESONIDE 0.5 MG/2ML IN SUSP
0.5000 mg | Freq: Two times a day (BID) | RESPIRATORY_TRACT | Status: DC
  Administered 2024-03-09 – 2024-03-11 (×5): 0.5 mg via RESPIRATORY_TRACT
  Filled 2024-03-09 (×5): qty 2

## 2024-03-09 MED ORDER — FAMOTIDINE IN NACL 20-0.9 MG/50ML-% IV SOLN
20.0000 mg | Freq: Once | INTRAVENOUS | Status: AC
  Administered 2024-03-09: 20 mg via INTRAVENOUS
  Filled 2024-03-09: qty 50

## 2024-03-09 MED ORDER — FLUTICASONE PROPIONATE 50 MCG/ACT NA SUSP
2.0000 | Freq: Every day | NASAL | Status: AC
  Administered 2024-03-09 – 2024-03-14 (×6): 2 via NASAL
  Filled 2024-03-09: qty 16

## 2024-03-09 NOTE — Significant Event (Signed)
 Rapid Response Event Note   Reason for Call :  Possible allergic reaction  Initial Focused Assessment:  When arrived to unit, patient alert and oriented x 4, able to protect airway, and already has received Benadryl  and infusing Pepcid  IV. The skin around his eyes are slightly red bilaterally, but the sclera and pupils appear normal. Lungs are without wheezing at this time and seems to be moving air well. Patient reported difficulty breathing and worsening cough to his nurse since receiving Azithromycin  IV during the day. Prior to rapid response, bedside RN gave cough medication and throat spray per standing orders list. Since receiving Benadryl  and Pepcid , he states he feels better. No abnormal vitals documented. Patient able to swallow with minimal difficulty. Also seen with provider Lynwood Kipper, NP at bedside.  Plan of Care:  Bedside RN to complete Pepcid  infusion, monitor condition and call rapid response/provider if any worsening condition.   Alan LITTIE Portugal, RN

## 2024-03-09 NOTE — TOC Initial Note (Signed)
 Transition of Care [facility]) - Initial/Assessment Note    Patient Details  Name: Jimmy [name] MRN: [id number] Date of Birth: [birth year]  Transition of [facility]) CM/SW Contact:    Doneta Glenys DASEN, RN Phone Number: 03/09/2024, 2:38 PM  Clinical Narrative:                 CM met with patient at bedside. PTA states lives in an abandon building on [address], no income, and no insurance. Patient agreeable to financial assistance and resources for guidance with housing and shelter. Resources added to AVS and paper copies given. During visit discussed IRC and resources ability there. Patient will need assistance with transportation at discharge. Inpatient care management will follow progression for discharge.  Expected Discharge Plan: Homeless Shelter Barriers to Discharge: Inadequate or no insurance, Homeless with medical needs, Continued Medical Work up   Patient Goals and CMS Choice Patient states their goals for this hospitalization and ongoing recovery are:: Living in an abandone building on [address].gov Compare Post Acute Care list provided to:: Patient Choice offered to / list presented to : Patient [place] ownership interest in [facility].provided to:: Patient    Expected Discharge Plan and Services In-house Referral: Artist, PCP / Health Connect Discharge Planning Services: CM Consult, Medication Assistance   Living arrangements for the past 2 months: No permanent address, Homeless                 DME Arranged: N/A DME Agency: NA       HH Arranged: NA HH Agency: NA        Prior Living Arrangements/Services Living arrangements for the past 2 months: No permanent address, Homeless Lives with:: Self Patient language and need for interpreter reviewed:: Yes Do you feel safe going back to the place where you live?: No   I dont have other options  Need for Family Participation in Patient Care: Yes (Comment) Care giver support  system in place?: No (comment) Current home services:  (NA) Criminal Activity/Legal Involvement Pertinent to Current Situation/Hospitalization: No - Comment as needed  Activities of Daily Living   ADL Screening (condition at time of admission) Independently performs ADLs?: Yes (appropriate for developmental age) Is the patient deaf or have difficulty hearing?: No Does the patient have difficulty seeing, even when wearing glasses/contacts?: No Does the patient have difficulty concentrating, remembering, or making decisions?: No  Permission Sought/Granted Permission sought to share information with : Case Manager Permission granted to share information with : Yes, Verbal Permission Granted  Share Information with NAME: Finanical Navigator           Emotional Assessment Appearance:: Appears stated age Attitude/Demeanor/Rapport: Gracious Affect (typically observed): Appropriate Orientation: : Oriented to Self, Oriented to Place, Oriented to  Time, Oriented to Situation Alcohol / Substance Use: Not Applicable Psych Involvement: No (comment)  Admission diagnosis:  COPD exacerbation (HCC) [J44.1] COPD with acute exacerbation (HCC) [J44.1] Patient Active Problem List   Diagnosis Date Noted   Hypothyroidism 03/12/2022   Homelessness 03/11/2022   COPD with acute exacerbation (HCC) 03/10/2022   Hypokalemia 03/10/2022   Tobacco abuse 03/10/2022   Acute respiratory failure with hypoxia (HCC) 03/10/2022   Systolic murmur 03/10/2022   Influenza A 02/05/2021   Acute bronchitis 02/05/2021   Bronchitis 02/05/2021   PCP:  Pcp, No Pharmacy:   [facility] [address] [place] [address] Phone: [phone] Fax: [phone]  [facility]  [address] [place] [address] Phone: [phone] Fax: [phone]     Social Drivers of Health (SDOH) Social History: SDOH Screenings   Food Insecurity: Food  Insecurity Present (03/08/2024)  Housing: High Risk (03/08/2024)  Transportation Needs: Unmet Transportation Needs (03/08/2024)  Utilities: At Risk (03/08/2024)  Social Connections: Socially Isolated (03/08/2024)  Tobacco Use: High Risk (03/08/2024)   SDOH Interventions:     Readmission Risk Interventions     No data to display

## 2024-03-09 NOTE — Plan of Care (Signed)

## 2024-03-09 NOTE — Plan of Care (Signed)
   Problem: Education: Goal: Knowledge of General Education information will improve Description: Including pain rating scale, medication(s)/side effects and non-pharmacologic comfort measures Outcome: Progressing   Problem: Health Behavior/Discharge Planning: Goal: Ability to manage health-related needs will improve Outcome: Progressing   Problem: Clinical Measurements: Goal: Ability to maintain clinical measurements within normal limits will improve Outcome: Progressing Goal: Will remain free from infection Outcome: Progressing Goal: Diagnostic test results will improve Outcome: Progressing Goal: Respiratory complications will improve Outcome: Progressing Goal: Cardiovascular complication will be avoided Outcome: Progressing   Problem: Activity: Goal: Risk for activity intolerance will decrease Outcome: Progressing   Problem: Nutrition: Goal: Adequate nutrition will be maintained Outcome: Progressing   Problem: Pain Managment: Goal: General experience of comfort will improve and/or be controlled Outcome: Progressing   Problem: Safety: Goal: Ability to remain free from injury will improve Outcome: Progressing   Problem: Skin Integrity: Goal: Risk for impaired skin integrity will decrease Outcome: Progressing

## 2024-03-09 NOTE — Evaluation (Signed)
 Clinical/Bedside Swallow Evaluation Patient Details  Name: Jimmy [name] MRN: [id number] Date of Birth: 09-27-1955  Today's Date: 03/09/2024 Time: SLP Start Time (ACUTE ONLY): 0940 SLP Stop Time (ACUTE ONLY): 0956 SLP Time Calculation (min) (ACUTE ONLY): 16 min  Past Medical History: History reviewed. No pertinent past medical history. Past Surgical History:  Past Surgical History:  Procedure Laterality Date   APPENDECTOMY     HPI:  Jimmy [name] is a 68 y.o. male with medical history significant for homelessness, hypothyroidism and emphysema who was admitted to the hospital with cough likely due to acute exacerbation of COPD.  Patient stated he was in his usual state of health until 03/07/24, when late in the evening he had sudden onset of severe coughing spells, productive of thick sputum.  He does still smoke.  Per MD note, work-up in the emergency department showed no evidence of acute infection and he was treated with steroids and breathing treatments and noted only slight improvement.  On further review of systems, patient stated that he often chokes and coughs when eating and feels like he is going to regurgitate his food. ST consulted for clinical swallow evaluation.  Pt currently on Regular/thin liquid diet.    Assessment / Plan / Recommendation  Clinical Impression  Recommend continue current diet of Regular/thin liquids with pt following esophageal precautions (provided via information sheet) and ensure breathing/swallowing reciprocity d/t COPD/deconditioning.  May consider esophageal assessment if symptoms persist. No further ST required during acute stay.    Pt seen for clinical swallow evaluation with a normal oropharyngeal swallow noted with all consistencies.  Esophageal precaution information provided for reported symptoms including sensation of intermittent regurgitation and globus sensation.  Pt also provided education re: breathing/swallowing reciprocity to regulate breathing  during intake of all food/liquids.  Pt in agreement with precautions/strategies to utilize during po intake.  Pt was noted to cough prior to intake and exhibited coughing post intake, but no regurgitation observed during trial, although esophageal component cannot be ruled out.  May consider esophageal assessment if symptoms persist.  SLP Visit Diagnosis: Dysphagia, unspecified (R13.10)    Aspiration Risk  Mild aspiration risk    Diet Recommendation   Thin;Age appropriate regular  Medication Administration: Whole meds with liquid    Other Recommendations Recommended Consults: Consider esophageal assessment;Consider GI evaluation;Other (Comment) (prn if symptoms persist) Oral Care Recommendations: Oral care BID;Patient independent with oral care     Swallow Evaluation Recommendations  Po diet; Regular/thin with esophageal/breathing/swallowing reciprocity precautions in place   Assistance Recommended at Discharge  TBD  Functional Status Assessment Patient has had a recent decline in their functional status and demonstrates the ability to make significant improvements in function in a reasonable and predictable amount of time.  Frequency and Duration  (evaluation only)          Prognosis Prognosis for improved oropharyngeal function: Good      Swallow Study   General Date of Onset: 03/08/24 HPI: Jimmy [name] is a 68 y.o. male with medical history significant for homelessness, hypothyroidism and emphysema who was admitted to the hospital with cough likely due to acute exacerbation of COPD.  Patient states he was in his usual state of health until last night, when late in the evening he had sudden onset of severe coughing spells, productive of thick sputum.  He does still smoke.  Workup in the emergency department as detailed below shows no evidence of acute infection, he has been treated with steroids and breathing treatments and says that  he is only slightly better.  On further review of  systems, patient states that he often chokes and coughs when eating and feels like he is going to regurgitate his food. ST consulted for clinical swallow evaluation.  Pt currently on Regular/thin liquid diet. Type of Study: Bedside Swallow Evaluation Previous Swallow Assessment: n/a Diet Prior to this Study: Regular;Thin liquids (Level 0) Temperature Spikes Noted: No Respiratory Status: Nasal cannula (2L) History of Recent Intubation: No Behavior/Cognition: Alert;Cooperative Oral Cavity Assessment: Within Functional Limits Oral Care Completed by SLP: Recent completion by staff Oral Cavity - Dentition: Edentulous Vision: Functional for self-feeding Self-Feeding Abilities: Able to feed self Patient Positioning: Upright in bed Baseline Vocal Quality: Hoarse;Other (comment) (slight) Volitional Cough: Strong Volitional Swallow: Able to elicit    Oral/Motor/Sensory Function Overall Oral Motor/Sensory Function: Within functional limits   Ice Chips Ice chips: Not tested   Thin Liquid Thin Liquid: Impaired Presentation: Cup;Straw Pharyngeal  Phase Impairments: Cough - Delayed Other Comments: hx of esophageal symptoms; coughing noted prior to intake and after intake, so unsure if related to swallow function, possible esophageal component    Nectar Thick Nectar Thick Liquid: Not tested   Honey Thick Honey Thick Liquid: Not tested   Puree Puree: Within functional limits Presentation: Self Fed   Solid     Solid: Impaired Presentation: Self Fed Oral Phase Impairments: Impaired mastication Oral Phase Functional Implications: Impaired mastication Pharyngeal Phase Impairments: Cough - Delayed Other Comments: potential esophageal component      [name],M.S.,CCC-SLP 03/09/2024,10:07 AM

## 2024-03-09 NOTE — Progress Notes (Addendum)
       Overnight   NAME: Jimmy [name] MRN: [id number] DOB : [birth year]    Date of Service   03/09/2024   HPI/Events of Note    Notified by RN for concern of reaction to new antibiotic with reported face reddening in the eye and cheek area that did not look similar earlier in the shift.  Patient reportedly has not had Azithromycin  before.     Interventions/ Plan   Benadryl  now Pepcid  IV now Evaluate AM antibiotic dosing.      Update 0222 Hrs   Patient was ok at bedside visit and receiving the Bendaryl and Pepcid . No upper airway compromise. Patient will wait on further antibiotic treatment until rounds evaluation. Also Magic mouthwash for mouth throat pain   Lynwood Kipper BSN MSNA MSN ACNPC-AG Acute Care Nurse Practitioner [facility]

## 2024-03-09 NOTE — Progress Notes (Signed)
 patient was coughing, dyspnea and complaining of his throat being sore. RN administered prn robitussin, prn chloraseptic spray and he has had a prn breathing treatment. Patient says his throat still feels sore and feels harder to swallow than before and his face is reddened in his eye and cheek area, did not look like this earlier in the shift.   RN concerned this may be an allergic reaction. Patient states he never had azithromycin  before receiving here at the hospital on 03/08/24 at 0909.    RN notified Lynwood Kipper. NP of the above information.   Orders Received: 1x dose po benadryl  25mg  and IV pepcid  20mg   Intervention: RN administered po benadryl  and started the IV pepcid .   Patient began to say he is having trouble breathing and it feels like his throat is closing.   Vitals obtained and Rapid Response called.   BP 111/73 RR 20 HR 76 O2 sat 98% on 2L [place] Temp 98.1

## 2024-03-09 NOTE — Progress Notes (Signed)
 PROGRESS NOTE    Jimmy [name]  FMW:[id number] DOB: January 10, 1956 DOA: 03/08/2024 PCP: Pcp, No    Chief Complaint  Patient presents with   Shortness of Breath    Brief Narrative:  Patient 68 year old gentleman history of homelessness, hypothyroidism, emphysema admitted to the hospital with a productive cough felt likely secondary to acute COPD exacerbation.   Assessment & Plan:   Principal Problem:   Acute respiratory failure with hypoxia (HCC) Active Problems:   COPD with acute exacerbation (HCC)   Hypothyroidism   Leukocytosis   Dysphagia  #1 acute hypoxic respiratory failure secondary to acute COPD exacerbation -Patient noted not to be on O2 at baseline not on any scheduled medications. - With history of emphysema noted to have previously been hospitalized with an acute COPD exacerbation. - Patient on admission with sats of 86% on room air. - Chest x-ray done on admission with hyperinflated and hyperlucent lungs, suggestive of underlying COPD.  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 -SARS coronavirus 2 by PCR negative. -Influenza A by PCR negative. -Influenza B by PCR negative. -RSV by PCR negative. - Patient place on scheduled DuoNebs, prednisone . - Placed on Pulmicort , Flonase , Mucinex , Claritin , PPI. - Patient noted to have been placed on azithromycin  on admission however overnight due to concerns for possible reaction to antibiotics azithromycin  discontinued and patient received a dose of Benadryl  and Pepcid  with clinical improvement. - Place on IV Rocephin . - Supportive care.  2.??  Allergic reaction -Overnight it is noted that there was a concern for reaction to azithromycin  as patient had reported face reddening in the eye and cheek area which did not look similar early on in the shift.  Night coverage. - Patient given Benadryl  and IV Pepcid  with clinical improvement - Discontinue azithromycin . - Patient noted to have tolerated doxycycline  during  last hospitalization and received a dose of IV Rocephin  on presentation. - Placed on IV Rocephin  secondary to problem #1.  3.  Dysphagia -Per admitting physician patient has described a several month history of choking sensation intermittently with p.o. intake. - SLP consulted.  4.  Hypothyroidism -Patient during last hospitalization was noted to be hypothyroid and started on Synthroid  during the hospitalization. - Patient noted to have been discharged on Synthroid  50 mcg daily. - Patient states not on any medications prior to admission. - TSH obtained at 28.7. - Will place back on Synthroid  50 mcg daily and patient will need outpatient follow-up with repeat TFTs done in 4 to 6 weeks.  5.  Leukocytosis -Likely reactive leukocytosis as patient with no evidence of an acute infection. - Chest x-ray done negative for any acute infiltrate. - Patient afebrile. - Patient with no urinary symptoms. - Leukocytosis trending down.   DVT prophylaxis: Lovenox  Code Status: Full Family Communication: Updated patient.  No family at bedside. Disposition: Patient noted to be homeless/TBD  Status is: Inpatient Remains inpatient appropriate because: Severity of illness   Consultants:  None  Procedures:  Chest x-ray 03/08/2024   Antimicrobials:  Anti-infectives (From admission, onward)    Start     Dose/Rate Route Frequency Ordered Stop   03/09/24 1000  doxycycline  (VIBRA -TABS) tablet 100 mg  Status:  Discontinued        100 mg Oral Every 12 hours 03/09/24 0857 03/09/24 0858   03/09/24 1000  cefTRIAXone  (ROCEPHIN ) 2 g in sodium chloride  0.9 % 100 mL IVPB        2 g 200 mL/hr over 30 Minutes Intravenous Every 24 hours 03/09/24 0858 03/13/24  9040   03/08/24 0900  azithromycin  (ZITHROMAX ) 500 mg in sodium chloride  0.9 % 250 mL IVPB  Status:  Discontinued        500 mg 250 mL/hr over 60 Minutes Intravenous Every 24 hours 03/08/24 0839 03/09/24 0858   03/08/24 0815  azithromycin  (ZITHROMAX ) 500  mg in sodium chloride  0.9 % 250 mL IVPB  Status:  Discontinued        500 mg 250 mL/hr over 60 Minutes Intravenous  Once 03/08/24 0807 03/08/24 0839   03/08/24 0815  cefTRIAXone  (ROCEPHIN ) 1 g in sodium chloride  0.9 % 100 mL IVPB        1 g 200 mL/hr over 30 Minutes Intravenous  Once 03/08/24 0807 03/08/24 9096         Subjective: Patient states still with some shortness of breath.  Wheezing improving.  Patient with complaints of productive cough.  No chest pain.  No abdominal pain.  Events overnight noted  Objective: Vitals:   03/09/24 0422 03/09/24 0608 03/09/24 1422 03/09/24 1553  BP: 108/71  123/86   Pulse: 76  87   Resp: 16  16   Temp: 98.2 F (36.8 C)  98.1 F (36.7 C)   TempSrc:      SpO2: 98% 97% 98% 94%  Weight:      Height:        Intake/Output Summary (Last 24 hours) at 03/09/2024 1809 Last data filed at 03/09/2024 1600 Gross per 24 hour  Intake 920 ml  Output 380 ml  Net 540 ml   Filed Weights   03/08/24 0314 03/08/24 0934  Weight: 53.5 kg 52.2 kg    Examination:  General exam: NAD. Respiratory system: Minimal expiratory wheezing.  No crackles.  No rhonchi.  Fair air movement.  Speaking in full sentences.   Cardiovascular system: S1 & S2 heard, RRR. No JVD, murmurs, rubs, gallops or clicks. No pedal edema. Gastrointestinal system: Abdomen is nondistended, soft and nontender. No organomegaly or masses felt. Normal bowel sounds heard. Central nervous system: Alert and oriented. No focal neurological deficits. Extremities: Symmetric 5 x 5 power. Skin: No rashes, lesions or ulcers Psychiatry: Judgement and insight appear normal. Mood & affect appropriate.     Data Reviewed: I have personally reviewed following labs and imaging studies  CBC: Recent Labs  Lab 03/08/24 0611 03/09/24 0427  WBC 18.8* 13.0*  HGB 13.3 12.7*  HCT 39.0 38.3*  MCV 94.4 96.2  PLT 290 288    Basic Metabolic Panel: Recent Labs  Lab 03/08/24 0611 03/09/24 0427  NA 134*  137  K 4.1 3.6  CL 98 103  CO2 22 24  GLUCOSE 104* 110*  BUN 35* 34*  CREATININE 1.15 0.95  CALCIUM 9.3 9.4    GFR: Estimated Creatinine Clearance: 54.9 mL/min (by C-G formula based on SCr of 0.95 mg/dL).  Liver Function Tests: No results for input(s): AST, ALT, ALKPHOS, BILITOT, PROT, ALBUMIN in the last 168 hours.  CBG: No results for input(s): GLUCAP in the last 168 hours.   Recent Results (from the past 240 hours)  Resp panel by RT-PCR (RSV, Flu A&B, Covid) Anterior Nasal Swab     Status: None   Collection Time: 03/08/24  3:21 AM   Specimen: Anterior Nasal Swab  Result Value Ref Range Status   SARS Coronavirus 2 by RT PCR NEGATIVE NEGATIVE Final    Comment: (NOTE) SARS-CoV-2 target nucleic acids are NOT DETECTED.  The SARS-CoV-2 RNA is generally detectable in upper respiratory specimens during the  acute phase of infection. The lowest concentration of SARS-CoV-2 viral copies this assay can detect is 138 copies/mL. A negative result does not preclude SARS-Cov-2 infection and should not be used as the sole basis for treatment or other patient management decisions. A negative result may occur with  improper specimen collection/handling, submission of specimen other than nasopharyngeal swab, presence of viral mutation(s) within the areas targeted by this assay, and inadequate number of viral copies(<138 copies/mL). A negative result must be combined with clinical observations, patient history, and epidemiological information. The expected result is Negative.  Fact Sheet for Patients:  bloggercourse.com  Fact Sheet for Healthcare Providers:  seriousbroker.it  This test is no t yet approved or cleared by the [place]  FDA and  has been authorized for detection and/or diagnosis of SARS-CoV-2 by FDA under an Emergency Use Authorization (EUA). This EUA will remain  in effect (meaning this test can be used)  for the duration of the COVID-19 declaration under Section 564(b)(1) of the Act, 21 U.S.C.section 360bbb-3(b)(1), unless the authorization is terminated  or revoked sooner.       Influenza A by PCR NEGATIVE NEGATIVE Final   Influenza B by PCR NEGATIVE NEGATIVE Final    Comment: (NOTE) The Xpert Xpress SARS-CoV-2/FLU/RSV plus assay is intended as an aid in the diagnosis of influenza from Nasopharyngeal swab specimens and should not be used as a sole basis for treatment. Nasal washings and aspirates are unacceptable for Xpert Xpress SARS-CoV-2/FLU/RSV testing.  Fact Sheet for Patients: bloggercourse.com  Fact Sheet for Healthcare Providers: seriousbroker.it  This test is not yet approved or cleared by the [place]  FDA and has been authorized for detection and/or diagnosis of SARS-CoV-2 by FDA under an Emergency Use Authorization (EUA). This EUA will remain in effect (meaning this test can be used) for the duration of the COVID-19 declaration under Section 564(b)(1) of the Act, 21 U.S.C. section 360bbb-3(b)(1), unless the authorization is terminated or revoked.     Resp Syncytial Virus by PCR NEGATIVE NEGATIVE Final    Comment: (NOTE) Fact Sheet for Patients: bloggercourse.com  Fact Sheet for Healthcare Providers: seriousbroker.it  This test is not yet approved or cleared by the [place]  FDA and has been authorized for detection and/or diagnosis of SARS-CoV-2 by FDA under an Emergency Use Authorization (EUA). This EUA will remain in effect (meaning this test can be used) for the duration of the COVID-19 declaration under Section 564(b)(1) of the Act, 21 U.S.C. section 360bbb-3(b)(1), unless the authorization is terminated or revoked.  Performed at [facility], [address] [place], [place] [address]          Radiology Studies: DG  Chest 2 View Result Date: 03/08/2024 EXAM: 2 VIEW(S) XRAY OF THE CHEST 03/08/2024 04:02:00 AM COMPARISON: None available. CLINICAL HISTORY: cough FINDINGS: LUNGS AND PLEURA: Hyperinflated and hyperlucent lungs, suggestive of underlying COPD. No focal pulmonary opacity. No pleural effusion. No pneumothorax. HEART AND MEDIASTINUM: No acute abnormality of the cardiac and mediastinal silhouettes. BONES AND SOFT TISSUES: Multilevel degenerative changes of thoracic spine. IMPRESSION: 1. Hyperinflated and hyperlucent lungs, suggestive of underlying COPD. 2. 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 Electronically signed by: Dorethia Molt MD 03/08/2024 04:20 AM EST RP Workstation: [id number]        Scheduled Meds:  budesonide  (PULMICORT ) nebulizer solution  0.5 mg Nebulization BID   enoxaparin  (LOVENOX ) injection  40 mg Subcutaneous Q24H   fluticasone   2 spray Each Nare Daily   guaiFENesin   1,200 mg Oral BID  Influenza vac split trivalent PF  0.5 mL Intramuscular Tomorrow-1000   ipratropium-albuterol   3 mL Nebulization QID   levothyroxine   50 mcg Oral Q0600   loratadine   10 mg Oral Daily   nicotine   7 mg Transdermal Daily   pantoprazole   40 mg Oral Daily   pneumococcal 20-valent conjugate vaccine  0.5 mL Intramuscular Tomorrow-1000   predniSONE   40 mg Oral Q breakfast   Continuous Infusions:  cefTRIAXone  (ROCEPHIN )  IV Stopped (03/09/24 1201)   famotidine  (PEPCID ) IV Stopped (03/09/24 1029)     LOS: 0 days    Time spent: 40 minutes    Toribio Hummer, MD [facility]   To contact the attending provider between 7A-7P or the covering provider during after hours 7P-7A, please log into the web site [email or web address] and access using universal [place] password for that web site. If you do not have the password, please call the hospital operator.  03/09/2024, 6:09 PM

## 2024-03-09 NOTE — Progress Notes (Signed)
 Mobility Specialist - Progress Note   03/09/24 1300  Mobility  Activity Ambulated with assistance  Level of Assistance Contact guard assist, steadying assist  Assistive Device None  Distance Ambulated (ft) 15 ft  Range of Motion/Exercises Active  Activity Response Tolerated well  Mobility Referral Yes  Mobility visit 1 Mobility  Mobility Specialist Start Time (ACUTE ONLY) 1324  Mobility Specialist Stop Time (ACUTE ONLY) 1330  Mobility Specialist Time Calculation (min) (ACUTE ONLY) 6 min   Received in bed requesting assistance to restroom, very impulsive upon standing, gait very unsteady, almost falls with every step.  Advised for use of walker and pt declined,  Returned from restroom to bed with all needs met.  Cyndee Ada Mobility Specialist

## 2024-03-09 NOTE — Discharge Instructions (Signed)
 [facility]

## 2024-03-10 LAB — BASIC METABOLIC PANEL WITH GFR
Anion gap: 10 (ref 5–15)
BUN: 23 mg/dL (ref 8–23)
CO2: 24 mmol/L (ref 22–32)
Calcium: 9 mg/dL (ref 8.9–10.3)
Chloride: 103 mmol/L (ref 98–111)
Creatinine, Ser: 0.95 mg/dL (ref 0.61–1.24)
GFR, Estimated: >60 mL/min (ref >60)
Glucose, Bld: 97 mg/dL (ref 70–99)
Potassium: 3.9 mmol/L (ref 3.5–5.1)
Sodium: 137 mmol/L (ref 135–145)

## 2024-03-10 LAB — CBC
HCT: 39 % (ref 39.0–52.0)
Hemoglobin: 12.9 g/dL — ABNORMAL LOW (ref 13.0–17.0)
MCH: 31.9 pg (ref 26.0–34.0)
MCHC: 33.1 g/dL (ref 30.0–36.0)
MCV: 96.5 fL (ref 80.0–100.0)
Platelets: 310 K/uL (ref 150–400)
RBC: 4.04 MIL/uL — ABNORMAL LOW (ref 4.22–5.81)
RDW: 13.2 % (ref 11.5–15.5)
WBC: 10.6 K/uL — ABNORMAL HIGH (ref 4.0–10.5)
nRBC: 0 % (ref 0.0–0.2)

## 2024-03-10 MED ORDER — DIPHENHYDRAMINE HCL 25 MG PO CAPS
25.0000 mg | ORAL_CAPSULE | Freq: Once | ORAL | Status: AC | PRN
  Administered 2024-03-10: 25 mg via ORAL
  Filled 2024-03-10: qty 1

## 2024-03-10 MED ORDER — FAMOTIDINE 20 MG PO TABS: 20.0000 mg | ORAL_TABLET | Freq: Every day | ORAL | Status: DC

## 2024-03-10 MED ORDER — SALINE SPRAY 0.65 % NA SOLN
1.0000 | NASAL | Status: DC | PRN
  Administered 2024-03-10: 1 via NASAL
  Filled 2024-03-10: qty 44

## 2024-03-10 NOTE — Significant Event (Signed)
 Called by bedside RN, Jori, to come see patient for complaints of persistent coughing, feelings that he is choking on phlegm that he cannot expectorate, and persistent weakness. Patient vital signs are unremarkable except for respiratory rate 20-30 that correlates with reason for admission. Patient has received Mucinex , saline nasal spray, Robitussin, Tessalon  Perles, Chloraseptic throat spray, and various nebulizer treatments throughout the night. Reviewed disease process of COPD and symptom management with patient and bedside RN. Encouraged cessation of smoking, utilization of incentive spirometry and/or flutter valve, and rest. Consulted via secure chat with on call provider, Lynwood Kipper, NP. New order obtained for Benadryl  to help decrease drainage triggering cough. Bedside RN will continue to monitor and notify rapid response for any worsening of condition.   03/10/24 0245  Charting Type  Charting Type Full Reassessment Changes Noted (Rapid Response)  Focused Reassessment Changes Noted Neurological;Respiratory;Cardiac;Psychosocial;HEENT  Neurological  Neuro (WDL) X  Level of Consciousness Alert  Orientation Level Oriented X4  Cognition Appropriate at baseline;Appropriate attention/concentration;Appropriate judgement;Appropriate safety awareness;Appropriate for developmental age;Follows commands;No memory impairment  Neuro Symptoms Anxiety;Fatigue;Other (Comment) (c/o weakness)  Neuro symptoms relieved by Relaxation techniques (Comment) (discussion about reasons for anxiety / fatigue / weakness)  HEENT  HEENT (WDL) X  Vision Check No  Voice Clear  Throat Post nasal drip;Coughs with swallowing;Other (Comment) (redness)  Respiratory  Respiratory Pattern Regular;Unlabored;Symmetrical  Chest Assessment Chest expansion symmetrical  R Upper  Breath Sounds Diminished  L Upper Breath Sounds Diminished  R Lower Breath Sounds Diminished  L Lower Breath Sounds Diminished  Cough  Productive;Non-productive;Strong;Congested  Respiratory (WDL) X  Sputum Amount Moderate  Sputum Color Other (Comment) (unseen, pt used yaunker for oral suction)  Sputum Consistency Other (Comment) (unseen)  Respiratory Interventions Oral suction  Oral Suctioning/Secretions  Suction Type Oral  Suction Device Yankauer  Secretion Amount Moderate  Suction Tolerance Tolerated well  Cardiac  Cardiac (WDL) X  Pulse Regular  Heart Sounds S1, S2  Jugular Venous Distention (JVD) No  Antiarrhythmic device No  Psychosocial  Psychosocial (WDL) X  Patient Behaviors Appropriate for situation;Calm;Cooperative (patient acknowledges some panic associated with coughing spells.)  Needs Expressed Physical  Emotional support given Given to patient

## 2024-03-10 NOTE — Progress Notes (Signed)
 PROGRESS NOTE    Jimmy [name]  FMW:[id number] DOB: Oct 30, 1955 DOA: 03/08/2024 PCP: Pcp, No    Chief Complaint  Patient presents with   Shortness of Breath    Brief Narrative:  Patient 68 year old gentleman history of homelessness, hypothyroidism, emphysema admitted to the hospital with a productive cough felt likely secondary to acute COPD exacerbation.   Assessment & Plan:   Principal Problem:   Acute respiratory failure with hypoxia (HCC) Active Problems:   COPD with acute exacerbation (HCC)   Hypothyroidism   Leukocytosis   Dysphagia  #1 acute hypoxic respiratory failure secondary to acute COPD exacerbation -Patient noted not to be on O2 at baseline not on any scheduled medications. - With history of emphysema noted to have previously been hospitalized with an acute COPD exacerbation. - Patient on admission with sats of 86% on room air. - Chest x-ray done on admission with hyperinflated and hyperlucent lungs, suggestive of underlying COPD.  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 -SARS coronavirus 2 by PCR negative. -Influenza A by PCR negative. -Influenza B by PCR negative. -RSV by PCR negative. - Patient placed on scheduled DuoNebs, prednisone . - Continue Pulmicort , Flonase , Mucinex , Claritin , PPI. - Patient noted to have been placed on azithromycin  on admission however the evening of 03/08/2024, due to concerns for possible reaction to antibiotics azithromycin  discontinued and patient received a dose of Benadryl  and Pepcid  with clinical improvement. -Continue IV Rocephin . -Supportive care.  2.??  Allergic reaction -Overnight it is noted that there was a concern for reaction to azithromycin  as patient had reported face reddening in the eye and cheek area which did not look similar early on in the shift.  Night coverage. - Patient given Benadryl  and IV Pepcid  with clinical improvement - Discontinued azithromycin . - Patient noted to have tolerated  doxycycline  during last hospitalization and received a dose of IV Rocephin  on presentation. - Placed on IV Rocephin  secondary to problem #1. - Discontinue Pepcid .  3.  Dysphagia -Per admitting physician patient has described a several month history of choking sensation intermittently with p.o. intake. - SLP consulted.  4.  Hypothyroidism -Patient during last hospitalization was noted to be hypothyroid and started on Synthroid  during the hospitalization. - Patient noted to have been discharged on Synthroid  50 mcg daily. - Patient states not on any medications prior to admission. - TSH obtained at 28.7. - Continue Synthroid  50 mcg daily that was resumed on admission.  -Patient will need outpatient follow-up with PCP for repeat TFTs to be done in 4 to 6 weeks.   5.  Leukocytosis -Likely reactive leukocytosis as patient with no evidence of an acute infection in the setting of steroids.. - Chest x-ray done negative for any acute infiltrate. - Patient afebrile. - Patient with no urinary symptoms. - Leukocytosis trending down.   DVT prophylaxis: Lovenox  Code Status: Full Family Communication: Updated patient.  No family at bedside. Disposition: Patient noted to be homeless/TBD  Status is: Inpatient Remains inpatient appropriate because: Severity of illness/unsafe disposition   Consultants:  None  Procedures:  Chest x-ray 03/08/2024   Antimicrobials:  Anti-infectives (From admission, onward)    Start     Dose/Rate Route Frequency Ordered Stop   03/09/24 1000  doxycycline  (VIBRA -TABS) tablet 100 mg  Status:  Discontinued        100 mg Oral Every 12 hours 03/09/24 0857 03/09/24 0858   03/09/24 1000  cefTRIAXone  (ROCEPHIN ) 2 g in sodium chloride  0.9 % 100 mL IVPB  2 g 200 mL/hr over 30 Minutes Intravenous Every 24 hours 03/09/24 0858 03/13/24 0959   03/08/24 0900  azithromycin  (ZITHROMAX ) 500 mg in sodium chloride  0.9 % 250 mL IVPB  Status:  Discontinued        500 mg 250  mL/hr over 60 Minutes Intravenous Every 24 hours 03/08/24 0839 03/09/24 0858   03/08/24 0815  azithromycin  (ZITHROMAX ) 500 mg in sodium chloride  0.9 % 250 mL IVPB  Status:  Discontinued        500 mg 250 mL/hr over 60 Minutes Intravenous  Once 03/08/24 0807 03/08/24 0839   03/08/24 0815  cefTRIAXone  (ROCEPHIN ) 1 g in sodium chloride  0.9 % 100 mL IVPB        1 g 200 mL/hr over 30 Minutes Intravenous  Once 03/08/24 0807 03/08/24 0903         Subjective: Patient states not feeling too well, still with cough.  Patient states still with wheezing.   Objective: Vitals:   03/10/24 0749 03/10/24 0753 03/10/24 1211 03/10/24 1225  BP: 127/71  (!) 140/73   Pulse: 97  85   Resp: 18  16   Temp: 98.5 F (36.9 C)  98.3 F (36.8 C)   TempSrc: Oral  Oral   SpO2: 99% 96% 93% 93%  Weight:      Height:        Intake/Output Summary (Last 24 hours) at 03/10/2024 1313 Last data filed at 03/10/2024 0806 Gross per 24 hour  Intake 680 ml  Output 500 ml  Net 180 ml   Filed Weights   03/08/24 0314 03/08/24 0934  Weight: 53.5 kg 52.2 kg    Examination:  General exam: NAD. Respiratory system: Scattered coarse breath sounds.  Minimal expiratory wheezing.  No crackles.  Fair air movement.  Speaking in full sentences.  Cardiovascular system: RRR no murmurs rubs or gallops.  No JVD.  No lower extremity edema.  Gastrointestinal system: Abdomen is soft, nontender, nondistended, positive bowel sounds.  No rebound.  No guarding.  Central nervous system: Alert and oriented. No focal neurological deficits. Extremities: Symmetric 5 x 5 power. Skin: No rashes, lesions or ulcers Psychiatry: Judgement and insight appear normal. Mood & affect appropriate.     Data Reviewed: I have personally reviewed following labs and imaging studies  CBC: Recent Labs  Lab 03/08/24 0611 03/09/24 0427 03/10/24 0427  WBC 18.8* 13.0* 10.6*  HGB 13.3 12.7* 12.9*  HCT 39.0 38.3* 39.0  MCV 94.4 96.2 96.5  PLT 290 288  310    Basic Metabolic Panel: Recent Labs  Lab 03/08/24 0611 03/09/24 0427 03/10/24 0427  NA 134* 137 137  K 4.1 3.6 3.9  CL 98 103 103  CO2 22 24 24   GLUCOSE 104* 110* 97  BUN 35* 34* 23  CREATININE 1.15 0.95 0.95  CALCIUM 9.3 9.4 9.0    GFR: Estimated Creatinine Clearance: 54.9 mL/min (by C-G formula based on SCr of 0.95 mg/dL).  Liver Function Tests: No results for input(s): AST, ALT, ALKPHOS, BILITOT, PROT, ALBUMIN in the last 168 hours.  CBG: No results for input(s): GLUCAP in the last 168 hours.   Recent Results (from the past 240 hours)  Resp panel by RT-PCR (RSV, Flu A&B, Covid) Anterior Nasal Swab     Status: None   Collection Time: 03/08/24  3:21 AM   Specimen: Anterior Nasal Swab  Result Value Ref Range Status   SARS Coronavirus 2 by RT PCR NEGATIVE NEGATIVE Final    Comment: (NOTE) SARS-CoV-2  target nucleic acids are NOT DETECTED.  The SARS-CoV-2 RNA is generally detectable in upper respiratory specimens during the acute phase of infection. The lowest concentration of SARS-CoV-2 viral copies this assay can detect is 138 copies/mL. A negative result does not preclude SARS-Cov-2 infection and should not be used as the sole basis for treatment or other patient management decisions. A negative result may occur with  improper specimen collection/handling, submission of specimen other than nasopharyngeal swab, presence of viral mutation(s) within the areas targeted by this assay, and inadequate number of viral copies(<138 copies/mL). A negative result must be combined with clinical observations, patient history, and epidemiological information. The expected result is Negative.  Fact Sheet for Patients:  bloggercourse.com  Fact Sheet for Healthcare Providers:  seriousbroker.it  This test is no t yet approved or cleared by the [place]  FDA and  has been authorized for detection and/or  diagnosis of SARS-CoV-2 by FDA under an Emergency Use Authorization (EUA). This EUA will remain  in effect (meaning this test can be used) for the duration of the COVID-19 declaration under Section 564(b)(1) of the Act, 21 U.S.C.section 360bbb-3(b)(1), unless the authorization is terminated  or revoked sooner.       Influenza A by PCR NEGATIVE NEGATIVE Final   Influenza B by PCR NEGATIVE NEGATIVE Final    Comment: (NOTE) The Xpert Xpress SARS-CoV-2/FLU/RSV plus assay is intended as an aid in the diagnosis of influenza from Nasopharyngeal swab specimens and should not be used as a sole basis for treatment. Nasal washings and aspirates are unacceptable for Xpert Xpress SARS-CoV-2/FLU/RSV testing.  Fact Sheet for Patients: bloggercourse.com  Fact Sheet for Healthcare Providers: seriousbroker.it  This test is not yet approved or cleared by the [place]  FDA and has been authorized for detection and/or diagnosis of SARS-CoV-2 by FDA under an Emergency Use Authorization (EUA). This EUA will remain in effect (meaning this test can be used) for the duration of the COVID-19 declaration under Section 564(b)(1) of the Act, 21 U.S.C. section 360bbb-3(b)(1), unless the authorization is terminated or revoked.     Resp Syncytial Virus by PCR NEGATIVE NEGATIVE Final    Comment: (NOTE) Fact Sheet for Patients: bloggercourse.com  Fact Sheet for Healthcare Providers: seriousbroker.it  This test is not yet approved or cleared by the [place]  FDA and has been authorized for detection and/or diagnosis of SARS-CoV-2 by FDA under an Emergency Use Authorization (EUA). This EUA will remain in effect (meaning this test can be used) for the duration of the COVID-19 declaration under Section 564(b)(1) of the Act, 21 U.S.C. section 360bbb-3(b)(1), unless the authorization is terminated  or revoked.  Performed at [facility], [address], [place], [place] [address]          Radiology Studies: No results found.       Scheduled Meds:  budesonide  (PULMICORT ) nebulizer solution  0.5 mg Nebulization BID   enoxaparin  (LOVENOX ) injection  40 mg Subcutaneous Q24H   [START ON 03/11/2024] famotidine   20 mg Oral Daily   fluticasone   2 spray Each Nare Daily   guaiFENesin   1,200 mg Oral BID   Influenza vac split trivalent PF  0.5 mL Intramuscular Tomorrow-1000   ipratropium-albuterol   3 mL Nebulization QID   levothyroxine   50 mcg Oral Q0600   loratadine   10 mg Oral Daily   nicotine   7 mg Transdermal Daily   pantoprazole   40 mg Oral Daily   pneumococcal 20-valent conjugate vaccine  0.5 mL Intramuscular Tomorrow-1000  predniSONE   40 mg Oral Q breakfast   Continuous Infusions:  cefTRIAXone  (ROCEPHIN )  IV 2 g (03/10/24 0956)     LOS: 1 day    Time spent: 35 minutes    Toribio Hummer, MD [facility]   To contact the attending provider between 7A-7P or the covering provider during after hours 7P-7A, please log into the web site [email or web address] and access using universal [place] password for that web site. If you do not have the password, please call the hospital operator.  03/10/2024, 1:13 PM

## 2024-03-10 NOTE — Plan of Care (Signed)

## 2024-03-11 ENCOUNTER — Inpatient Hospital Stay (HOSPITAL_COMMUNITY): Payer: MEDICAID

## 2024-03-11 ENCOUNTER — Telehealth: Payer: Self-pay | Admitting: Pulmonary Disease

## 2024-03-11 DIAGNOSIS — Z720 Tobacco use: Secondary | ICD-10-CM

## 2024-03-11 LAB — RENAL FUNCTION PANEL
Albumin: 3.7 g/dL (ref 3.5–5.0)
Anion gap: 11 (ref 5–15)
BUN: 21 mg/dL (ref 8–23)
CO2: 25 mmol/L (ref 22–32)
Calcium: 8.8 mg/dL — ABNORMAL LOW (ref 8.9–10.3)
Chloride: 99 mmol/L (ref 98–111)
Creatinine, Ser: 0.92 mg/dL (ref 0.61–1.24)
GFR, Estimated: >60 mL/min (ref >60)
Glucose, Bld: 93 mg/dL (ref 70–99)
Phosphorus: 3.7 mg/dL (ref 2.5–4.6)
Potassium: 4.1 mmol/L (ref 3.5–5.1)
Sodium: 135 mmol/L (ref 135–145)

## 2024-03-11 LAB — CBC WITH DIFFERENTIAL/PLATELET
Abs Immature Granulocytes: 0.06 K/uL (ref 0.00–0.07)
Basophils Absolute: 0 K/uL (ref 0.0–0.1)
Basophils Relative: 0 %
Eosinophils Absolute: 0 K/uL (ref 0.0–0.5)
Eosinophils Relative: 0 %
HCT: 40.2 % (ref 39.0–52.0)
Hemoglobin: 13.2 g/dL (ref 13.0–17.0)
Immature Granulocytes: 1 %
Lymphocytes Relative: 8 %
Lymphs Abs: 0.7 K/uL (ref 0.7–4.0)
MCH: 31.8 pg (ref 26.0–34.0)
MCHC: 32.8 g/dL (ref 30.0–36.0)
MCV: 96.9 fL (ref 80.0–100.0)
Monocytes Absolute: 1.2 K/uL — ABNORMAL HIGH (ref 0.1–1.0)
Monocytes Relative: 13 %
Neutro Abs: 7 K/uL (ref 1.7–7.7)
Neutrophils Relative %: 78 %
Platelets: 307 K/uL (ref 150–400)
RBC: 4.15 MIL/uL — ABNORMAL LOW (ref 4.22–5.81)
RDW: 13.2 % (ref 11.5–15.5)
WBC: 9 K/uL (ref 4.0–10.5)
nRBC: 0 % (ref 0.0–0.2)

## 2024-03-11 LAB — MAGNESIUM: Magnesium: 2.3 mg/dL (ref 1.7–2.4)

## 2024-03-11 MED ORDER — HYDROCOD POLI-CHLORPHE POLI ER 10-8 MG/5ML PO SUER: 5.0000 mL | Freq: Two times a day (BID) | ORAL | Status: DC | PRN

## 2024-03-11 MED ORDER — PANTOPRAZOLE SODIUM 40 MG PO TBEC: 40.0000 mg | DELAYED_RELEASE_TABLET | Freq: Two times a day (BID) | ORAL | Status: DC

## 2024-03-11 MED ORDER — SODIUM CHLORIDE (PF) 0.9 % IJ SOLN
INTRAMUSCULAR | Status: AC
  Filled 2024-03-11: qty 50

## 2024-03-11 MED ORDER — BUDESON-GLYCOPYRROL-FORMOTEROL 160-9-4.8 MCG/ACT IN AERO
2.0000 | INHALATION_SPRAY | Freq: Two times a day (BID) | RESPIRATORY_TRACT | Status: DC
  Administered 2024-03-11 – 2024-03-14 (×6): 2 via RESPIRATORY_TRACT
  Filled 2024-03-11 (×2): qty 5.9

## 2024-03-11 MED ORDER — IOHEXOL 300 MG/ML  SOLN
100.0000 mL | Freq: Once | INTRAMUSCULAR | Status: AC | PRN
  Administered 2024-03-11: 100 mL via INTRAVENOUS

## 2024-03-11 MED ORDER — SODIUM CHLORIDE 3 % IN NEBU
4.0000 mL | INHALATION_SOLUTION | Freq: Four times a day (QID) | RESPIRATORY_TRACT | Status: DC
  Administered 2024-03-11 – 2024-03-12 (×2): 4 mL via RESPIRATORY_TRACT
  Filled 2024-03-11 (×5): qty 4

## 2024-03-11 MED ORDER — NICOTINE 14 MG/24HR TD PT24
14.0000 mg | MEDICATED_PATCH | Freq: Every day | TRANSDERMAL | Status: DC
  Administered 2024-03-11 – 2024-03-14 (×4): 14 mg via TRANSDERMAL
  Filled 2024-03-11 (×4): qty 1

## 2024-03-11 MED ORDER — PANTOPRAZOLE SODIUM 40 MG IV SOLR
40.0000 mg | Freq: Two times a day (BID) | INTRAVENOUS | Status: DC
  Administered 2024-03-11 – 2024-03-14 (×6): 40 mg via INTRAVENOUS
  Filled 2024-03-11 (×6): qty 10

## 2024-03-11 NOTE — Plan of Care (Signed)
   Problem: Education: Goal: Knowledge of General Education information will improve Description Including pain rating scale, medication(s)/side effects and non-pharmacologic comfort measures Outcome: Progressing   Problem: Clinical Measurements: Goal: Ability to maintain clinical measurements within normal limits will improve Outcome: Progressing   Problem: Clinical Measurements: Goal: Will remain free from infection Outcome: Progressing

## 2024-03-11 NOTE — Hospital Course (Addendum)
 Patient 68 year old gentleman history of homelessness, hypothyroidism, emphysema admitted to the hospital with a productive cough felt likely secondary to acute COPD exacerbation.  Hospitalization has been complicated by nausea vomiting and dysphagia and GI has been consulted as well as Pulmonary.  Pulmonary is adjusting his medications and added doxycycline  given atypical infection.  GI took the patient for an EGD with dilation 03/13/24 and no esophageal abnormalities were found. Anticipating D/C in the next 24 hours.  Assessment and Plan:  Acute Hypoxic Respiratory failure secondary to acute COPD Exacerbation: Patient noted not to be on O2 at baseline not on any scheduled medications. With history of emphysema noted to have previously been hospitalized with an acute COPD exacerbation. -Patient on admission with sats of 86% on room air. Chest x-ray done on admission with hyperinflated and hyperlucent lungs, suggestive of underlying COPD.  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 Influenza A/B, SARS coronavirus 2, and RSV by PCR negative. -Patient placed on scheduled DuoNebs, prednisone . -Continue Pulmicort , Flonase , Mucinex , Claritin , PPI. -Patient noted to have been placed on azithromycin  on admission however the evening of 03/08/2024, due to concerns for possible reaction to antibiotics azithromycin  discontinued and patient received a dose of Benadryl  and Pepcid  with clinical improvement. -Continue IV Rocephin  and Doxycycline  added by Pulmonary for concern for atypical infection or viral pneumonitis  -Supportive care. -Patient to be discharged on Breztri  and albuterol  inhalers as well as steroid taper and antibiotics -Repeat CXR 12/10 showed Mildly progressive bronchitic changes superimposed on COPD.  -Pulmonary Consulted for further evaluation and recommendations and recommended obtaining outpatient PFTs for formal diagnosis.  They are also recommending continue DuoNebs 4 times daily,  hypertonic saline nebs 4 times daily, chest PT and prescribing Breztri  inhaler at discharge as well as an albuterol  inhaler as needed.  They have advised the patient to go to pulmonary rehabilitation acknowledging his psychosocial limitations and recommending an Ambulatory home O2 screen prior to discharge. -Pulmonary also obtaining MRSA swab and sputum Cx (pending) -CT Chest done and showed Bilateral bronchial wall thickening, with scattered tree in bud nodular airspace disease within the right upper, right lower, and left lower lobes. The appearance is most consistent with infection and bilateral bronchopneumonia, though sequela of inflammation or aspiration could give this pattern as well. Aortic Atherosclerosis. Stable emphysema. -Will check Ambulatory O2 Screen in the AM and he did not desaturate; Repeat CXR this AM showed that he had Hyperexpansion with chronic interstitial coarsening. No acute cardiopulmonary findings. - Patient will need to follow-up with PCP and pulmonology outpatient setting.  ?? Allergic Reaction: The night before last it is noted that there was a concern for reaction to azithromycin  as patient had reported face reddening in the eye and cheek area which did not look similar early on in the shift. Patient given Benadryl  and IV Pepcid  with clinical improvement  Discontinued azithromycin .  Patient noted to have tolerated doxycycline  during last hospitalization and received a dose of IV Rocephin  on presentation. Placed on IV Rocephin  secondary to problem #1. Discontinue Pepcid .   Dysphagia: Per admitting physician patient has described a several month history of choking sensation intermittently with p.o. intake. SLP consulted. Having N/V so will obtain CT Abd/Pelvis and obtain GI evaluation.  -GI recommends to follow CT scan of the abdomen pelvis and if it is negative recommending a barium swallow to further evaluate his nausea/vomiting and dysphagia. -Diet had been changed to  SOFT and he has been started on IV PPI with Protonix .   -Given his  continued swallowing issues GI is recommending EGD and he underwent this today.  Patient had no esophageal abnormality to explain his dysphagia and his esophagus was dilated.  There is a medium amount of food in the stomach as residue and chronic gastritis that was biopsied with a normal duodenal bulb and the 1st and 2nd portion of the duodenum. -He is improved and tolerated his diet and will need to follow-up with PCP and GI in outpatient setting   Hypothyroidism: Patient during last hospitalization was noted to be hypothyroid and started on Synthroid  during the hospitalization. Patient noted to have been discharged on Synthroid  50 mcg daily. Patient states not on any medications prior to admission. TSH obtained at 28.7. -Continue Levothyroxine  50 mcg daily that was resumed on admission.  Patient will need outpatient follow-up with PCP for repeat TFTs to be done in 4 to 6 weeks.    Leukocytosis: Improved and resolved. Likely reactive leukocytosis as patient with no evidence of an acute infection in the setting of steroids. WBC went from 13.0 -> 10.6 -> 9.0 -> 8.9 -> 7.0 and is now 7.1 at the time of the. Chest x-ray done negative for any acute infiltrate. Patient afebrile. Patient with no urinary symptoms. Leukocytosis trending down. Having N/V so obtained CT Abd/Pelvis which showed Large right inguinal hernia containing a segment of small bowel. No evidence of incarceration or bowel obstruction. Trace pelvic free fluid, likely reactive.  Patchy right lower lobe airspace disease, consistent with inflammatory or infectious etiology. Aortic  Atherosclerosis  Tobacco Abuse: Has a longstanding tobacco dependence and history of smoking.  The pulmonologist recommending low-dose annual CT scan screening; CT Scan of Chest showed stable Emphysema

## 2024-03-11 NOTE — Progress Notes (Signed)
 PROGRESS NOTE    Jimmy [name]  FMW:[id number] DOB: [birth year] DOA: 03/08/2024 PCP: Pcp, No   Brief Narrative:  Patient 68 year old gentleman history of homelessness, hypothyroidism, emphysema admitted to the hospital with a productive cough felt likely secondary to acute COPD exacerbation.  Hospitalization has been complicated by nausea vomiting and dysphagia and GI has been consulted as well as Pulmonary.  Assessment and Plan:  Acute Hypoxic Respiratory failure secondary to acute COPD Exacerbation: Patient noted not to be on O2 at baseline not on any scheduled medications. With history of emphysema noted to have previously been hospitalized with an acute COPD exacerbation. Patient on admission with sats of 86% on room air. Chest x-ray done on admission with hyperinflated and hyperlucent lungs, suggestive of underlying COPD.  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 Influenza A/B, SARS coronavirus 2, and RSV by PCR negative. -Patient placed on scheduled DuoNebs, prednisone . -Continue Pulmicort , Flonase , Mucinex , Claritin , PPI. -Patient noted to have been placed on azithromycin  on admission however the evening of 03/08/2024, due to concerns for possible reaction to antibiotics azithromycin  discontinued and patient received a dose of Benadryl  and Pepcid  with clinical improvement. -Continue IV Rocephin . -Supportive care. -Repeat CXR 12/10 showed Mildly progressive bronchitic changes superimposed on COPD.  -Pulmonary Consulted for further evaluation and recommendations and recommended obtaining outpatient PFTs for formal diagnosis.  They are also recommending continue DuoNebs 4 times daily, hypertonic saline nebs 4 times daily, chest PT and prescribing Breztri  inhaler at discharge as well as an albuterol  inhaler as needed.  They have advised the patient to go to pulmonary rehabilitation acknowledging his psychosocial limitations and recommending an amatory home O2 screen prior to  discharge.   ?? Allergic reaction: The night before last it is noted that there was a concern for reaction to azithromycin  as patient had reported face reddening in the eye and cheek area which did not look similar early on in the shift. Patient given Benadryl  and IV Pepcid  with clinical improvement  Discontinued azithromycin .  Patient noted to have tolerated doxycycline  during last hospitalization and received a dose of IV Rocephin  on presentation. Placed on IV Rocephin  secondary to problem #1. Discontinue Pepcid .   Dysphagia: Per admitting physician patient has described a several month history of choking sensation intermittently with p.o. intake. SLP consulted. Having N/V so will obtain CT Abd/Pelvis and obtain GI evaluation. GI recommends to follow CT scan of the abdomen pelvis and if it is negative recommending a barium swallow to further evaluate his nausea vomiting aphasia.  Currently they are recommending holding off endoscopic evaluation given his acute hypoxic respiratory failure and acute COPD exacerbation.  Diet has been changed to soft and he has been started on IV PPI with Protonix    Hypothyroidism: Patient during last hospitalization was noted to be hypothyroid and started on Synthroid  during the hospitalization. Patient noted to have been discharged on Synthroid  50 mcg daily. Patient states not on any medications prior to admission. TSH obtained at 28.7. -Continue Synthroid  50 mcg daily that was resumed on admission.  Patient will need outpatient follow-up with PCP for repeat TFTs to be done in 4 to 6 weeks.    Leukocytosis: Improved. Likely reactive leukocytosis as patient with no evidence of an acute infection in the setting of steroids. WBC went from 13.0 -> 10.6 -> 9.0. Chest x-ray done negative for any acute infiltrate. Patient afebrile. Patient with no urinary symptoms. Leukocytosis trending down. Having N/V so obtaining CT Abd/Pelvis  Tobacco Abuse: Has a longstanding tobacco  dependence and history of smoking.  The pulmonologist recommending low-dose annual CT scan screening and will be getting a CT scan of the chest as above.   DVT prophylaxis: enoxaparin  (LOVENOX ) injection 40 mg Start: 03/08/24 1000    Code Status: Full Code Family Communication: No family currently at bedside  Disposition Plan:  Level of care: Med-Surg Status is: Inpatient Remains inpatient appropriate because: Needs further clinical improvement and clearance by the specialists  Consultants:  Pulmonary Gastroenterology  Procedures:  As delineated as above  Antimicrobials:  Anti-infectives (From admission, onward)    Start     Dose/Rate Route Frequency Ordered Stop   03/09/24 1000  doxycycline  (VIBRA -TABS) tablet 100 mg  Status:  Discontinued        100 mg Oral Every 12 hours 03/09/24 0857 03/09/24 0858   03/09/24 1000  cefTRIAXone  (ROCEPHIN ) 2 g in sodium chloride  0.9 % 100 mL IVPB        2 g 200 mL/hr over 30 Minutes Intravenous Every 24 hours 03/09/24 0858 03/13/24 0959   03/08/24 0900  azithromycin  (ZITHROMAX ) 500 mg in sodium chloride  0.9 % 250 mL IVPB  Status:  Discontinued        500 mg 250 mL/hr over 60 Minutes Intravenous Every 24 hours 03/08/24 0839 03/09/24 0858   03/08/24 0815  azithromycin  (ZITHROMAX ) 500 mg in sodium chloride  0.9 % 250 mL IVPB  Status:  Discontinued        500 mg 250 mL/hr over 60 Minutes Intravenous  Once 03/08/24 0807 03/08/24 0839   03/08/24 0815  cefTRIAXone  (ROCEPHIN ) 1 g in sodium chloride  0.9 % 100 mL IVPB        1 g 200 mL/hr over 30 Minutes Intravenous  Once 03/08/24 0807 03/08/24 9096       Subjective: Seen and examined at bedside and was still not feeling very well and having a difficult time and coughing quite a bit.  States that he has some nausea vomiting with anything that he eats.  No lightheadedness or dizziness.  Just does not feel very well and continues to be short of breath.  No other concerns or complaint at this  time.  Objective: Vitals:   03/11/24 1137 03/11/24 1139 03/11/24 1211 03/11/24 1821  BP:   108/68   Pulse:   95   Resp:   18   Temp:   98 F (36.7 C)   TempSrc:   Oral   SpO2: (!) 80% 91% 91% 92%  Weight:      Height:        Intake/Output Summary (Last 24 hours) at 03/11/2024 1920 Last data filed at 03/11/2024 1753 Gross per 24 hour  Intake 360 ml  Output 1050 ml  Net -690 ml   Filed Weights   03/08/24 0314 03/08/24 0934  Weight: 53.5 kg 52.2 kg   Examination: Physical Exam:  Constitutional: Chronically ill-appearing Caucasian male who appears older than his stated age in some respiratory distress Respiratory: Diminished to auscultation bilaterally, no wheezing, rales, rhonchi or crackles. Normal respiratory effort and patient is not tachypenic. No accessory muscle use.  Unlabored breathing Cardiovascular: RRR, no murmurs / rubs / gallops. S1 and S2 auscultated. No extremity edema but has some varicose veins noted Abdomen: Soft, non-tender, non-distended. Bowel sounds positive.  GU: Deferred. Musculoskeletal: No clubbing / cyanosis of digits/nails. No joint deformity upper and lower extremities.  Skin: No rashes, lesions, ulcers limited skin evaluation. No induration; Warm and dry.  Neurologic: CN 2-12 grossly intact with  no focal deficits. Romberg sign and cerebellar reflexes not assessed.  Psychiatric: Normal judgment and insight. Alert and oriented x 3. Anxious mood  Data Reviewed: I have personally reviewed following labs and imaging studies  CBC: Recent Labs  Lab 03/08/24 0611 03/09/24 0427 03/10/24 0427 03/11/24 0432  WBC 18.8* 13.0* 10.6* 9.0  NEUTROABS  --   --   --  7.0  HGB 13.3 12.7* 12.9* 13.2  HCT 39.0 38.3* 39.0 40.2  MCV 94.4 96.2 96.5 96.9  PLT 290 288 310 307   Basic Metabolic Panel: Recent Labs  Lab 03/08/24 0611 03/09/24 0427 03/10/24 0427 03/11/24 0432  NA 134* 137 137 135  K 4.1 3.6 3.9 4.1  CL 98 103 103 99  CO2 22 24 24 25    GLUCOSE 104* 110* 97 93  BUN 35* 34* 23 21  CREATININE 1.15 0.95 0.95 0.92  CALCIUM 9.3 9.4 9.0 8.8*  MG  --   --   --  2.3  PHOS  --   --   --  3.7   GFR: Estimated Creatinine Clearance: 56.7 mL/min (by C-G formula based on SCr of 0.92 mg/dL). Liver Function Tests: Recent Labs  Lab 03/11/24 0432  ALBUMIN 3.7   No results for input(s): LIPASE, AMYLASE in the last 168 hours. No results for input(s): AMMONIA in the last 168 hours. Coagulation Profile: No results for input(s): INR, PROTIME in the last 168 hours. Cardiac Enzymes: No results for input(s): CKTOTAL, CKMB, CKMBINDEX, TROPONINI in the last 168 hours. BNP (last 3 results) No results for input(s): PROBNP in the last 8760 hours. HbA1C: No results for input(s): HGBA1C in the last 72 hours. CBG: No results for input(s): GLUCAP in the last 168 hours. Lipid Profile: No results for input(s): CHOL, HDL, LDLCALC, TRIG, CHOLHDL, LDLDIRECT in the last 72 hours. Thyroid  Function Tests: No results for input(s): TSH, T4TOTAL, FREET4, T3FREE, THYROIDAB in the last 72 hours. Anemia Panel: No results for input(s): VITAMINB12, FOLATE, FERRITIN, TIBC, IRON, RETICCTPCT in the last 72 hours. Sepsis Labs: No results for input(s): PROCALCITON, LATICACIDVEN in the last 168 hours.  Recent Results (from the past 240 hours)  Resp panel by RT-PCR (RSV, Flu A&B, Covid) Anterior Nasal Swab     Status: None   Collection Time: 03/08/24  3:21 AM   Specimen: Anterior Nasal Swab  Result Value Ref Range Status   SARS Coronavirus 2 by RT PCR NEGATIVE NEGATIVE Final    Comment: (NOTE) SARS-CoV-2 target nucleic acids are NOT DETECTED.  The SARS-CoV-2 RNA is generally detectable in upper respiratory specimens during the acute phase of infection. The lowest concentration of SARS-CoV-2 viral copies this assay can detect is 138 copies/mL. A negative result does not preclude  SARS-Cov-2 infection and should not be used as the sole basis for treatment or other patient management decisions. A negative result may occur with  improper specimen collection/handling, submission of specimen other than nasopharyngeal swab, presence of viral mutation(s) within the areas targeted by this assay, and inadequate number of viral copies(<138 copies/mL). A negative result must be combined with clinical observations, patient history, and epidemiological information. The expected result is Negative.  Fact Sheet for Patients:  bloggercourse.com  Fact Sheet for Healthcare Providers:  seriousbroker.it  This test is no t yet approved or cleared by the [place]  FDA and  has been authorized for detection and/or diagnosis of SARS-CoV-2 by FDA under an Emergency Use Authorization (EUA). This EUA will remain  in effect (meaning this test can  be used) for the duration of the COVID-19 declaration under Section 564(b)(1) of the Act, 21 U.S.C.section 360bbb-3(b)(1), unless the authorization is terminated  or revoked sooner.       Influenza A by PCR NEGATIVE NEGATIVE Final   Influenza B by PCR NEGATIVE NEGATIVE Final    Comment: (NOTE) The Xpert Xpress SARS-CoV-2/FLU/RSV plus assay is intended as an aid in the diagnosis of influenza from Nasopharyngeal swab specimens and should not be used as a sole basis for treatment. Nasal washings and aspirates are unacceptable for Xpert Xpress SARS-CoV-2/FLU/RSV testing.  Fact Sheet for Patients: bloggercourse.com  Fact Sheet for Healthcare Providers: seriousbroker.it  This test is not yet approved or cleared by the [place]  FDA and has been authorized for detection and/or diagnosis of SARS-CoV-2 by FDA under an Emergency Use Authorization (EUA). This EUA will remain in effect (meaning this test can be used) for the duration of  the COVID-19 declaration under Section 564(b)(1) of the Act, 21 U.S.C. section 360bbb-3(b)(1), unless the authorization is terminated or revoked.     Resp Syncytial Virus by PCR NEGATIVE NEGATIVE Final    Comment: (NOTE) Fact Sheet for Patients: bloggercourse.com  Fact Sheet for Healthcare Providers: seriousbroker.it  This test is not yet approved or cleared by the [place]  FDA and has been authorized for detection and/or diagnosis of SARS-CoV-2 by FDA under an Emergency Use Authorization (EUA). This EUA will remain in effect (meaning this test can be used) for the duration of the COVID-19 declaration under Section 564(b)(1) of the Act, 21 U.S.C. section 360bbb-3(b)(1), unless the authorization is terminated or revoked.  Performed at [facility], [address], [place], [place] [address]     Radiology Studies: DG CHEST PORT 1 VIEW Result Date: 03/11/2024 CLINICAL DATA:  Shortness of breath. EXAM: PORTABLE CHEST 1 VIEW COMPARISON:  03/08/2024 FINDINGS: Normal-sized heart. Clear lungs with normal vascularity. Mild peribronchial thickening. The lungs remain mildly hyperexpanded. Thoracolumbar spine degenerative changes and old, healed right rib fractures. IMPRESSION: Mildly progressive bronchitic changes superimposed on COPD. Electronically Signed   By: Elspeth Bathe M.D.   On: 03/11/2024 13:31   Scheduled Meds:  budesonide -glycopyrrolate -formoterol   2 puff Inhalation BID   enoxaparin  (LOVENOX ) injection  40 mg Subcutaneous Q24H   fluticasone   2 spray Each Nare Daily   guaiFENesin   1,200 mg Oral BID   Influenza vac split trivalent PF  0.5 mL Intramuscular Tomorrow-1000   ipratropium-albuterol   3 mL Nebulization QID   levothyroxine   50 mcg Oral Q0600   loratadine   10 mg Oral Daily   nicotine   14 mg Transdermal Daily   pantoprazole  (PROTONIX ) IV  40 mg Intravenous Q12H   pneumococcal 20-valent conjugate  vaccine  0.5 mL Intramuscular Tomorrow-1000   predniSONE   40 mg Oral Q breakfast   sodium chloride  HYPERTONIC  4 mL Nebulization QID   Continuous Infusions:  cefTRIAXone  (ROCEPHIN )  IV 2 g (03/11/24 0932)    LOS: 2 days   Alejandro Marker, DO [facility] Available via Epic secure chat 7am-7pm After these hours, please refer to coverage provider listed on [email or web address] 03/11/2024, 7:20 PM

## 2024-03-11 NOTE — Consult Note (Signed)
 Referring Provider: [facility] Primary Care Physician:  Pcp, No Primary Gastroenterologist: [place]  Reason for Consultation: Nausea and vomiting  HPI: Jimmy [name] is a 68 y.o. male with past medical history of emphysema, hypothyroidism and homelessness currently admitted to the hospital with shortness of breath and acute hypoxic respiratory failure with acute COPD exacerbation.  He is complaining dysphagia with choking sensation for last few months.  Also having nausea and vomiting during hospitalization.  GI is consulted for further evaluation. Blood work showed normal CBC.  Normal BMP.  Elevated TSH at 28.  Patient seen and examined at bedside.  He is complaining of ongoing difficulty breathing.  He has been having intermittent choking sensation for last several weeks but for the last 2 days he is having nausea and vomiting immediately after meals.  He is able to tolerate liquid but issues mostly with solid foods.  Had mild epigastric discomfort.  Denies any diarrhea or constipation.  Denies any blood in the stool or black stool.  Denies any NSAID use.  He continues to smoke.    History reviewed. No pertinent past medical history.  Past Surgical History:  Procedure Laterality Date   APPENDECTOMY      Prior to Admission medications   Not on File    Scheduled Meds:  budesonide  (PULMICORT ) nebulizer solution  0.5 mg Nebulization BID   enoxaparin  (LOVENOX ) injection  40 mg Subcutaneous Q24H   fluticasone   2 spray Each Nare Daily   guaiFENesin   1,200 mg Oral BID   Influenza vac split trivalent PF  0.5 mL Intramuscular Tomorrow-1000   ipratropium-albuterol   3 mL Nebulization QID   levothyroxine   50 mcg Oral Q0600   loratadine   10 mg Oral Daily   pantoprazole   40 mg Oral BID   pneumococcal 20-valent conjugate vaccine  0.5 mL Intramuscular Tomorrow-1000   predniSONE   40 mg Oral Q breakfast   Continuous Infusions:  cefTRIAXone  (ROCEPHIN )  IV 2 g (03/11/24 0932)   PRN  Meds:.acetaminophen  **OR** acetaminophen , albuterol , chlorpheniramine-HYDROcodone, guaiFENesin -dextromethorphan , ondansetron  **OR** ondansetron  (ZOFRAN ) IV, phenol, sodium chloride , traZODone   Allergies as of 03/08/2024   (No Known Allergies)    Family History  Problem Relation Age of Onset   CAD Neg Hx     Social History   Socioeconomic History   Marital status: Single    Spouse name: Not on file   Number of children: Not on file   Years of education: Not on file   Highest education level: Not on file  Occupational History   Not on file  Tobacco Use   Smoking status: Every Day    Current packs/day: 0.50    Types: Cigarettes   Smokeless tobacco: Never  Vaping Use   Vaping status: Never Used  Substance and Sexual Activity   Alcohol use: No   Drug use: Never   Sexual activity: Not on file  Other Topics Concern   Not on file  Social History Narrative   Not on file   Social Drivers of Health   Financial Resource Strain: Not on file  Food Insecurity: Food Insecurity Present (03/08/2024)   Hunger Vital Sign    Worried About Running Out of Food in the Last Year: Often true    Ran Out of Food in the Last Year: Often true  Transportation Needs: Unmet Transportation Needs (03/08/2024)   PRAPARE - Administrator, Civil Service (Medical): Yes    Lack of Transportation (Non-Medical): Yes  Physical Activity: Not on file  Stress: Not  on file  Social Connections: Socially Isolated (03/08/2024)   Social Connection and Isolation Panel    Frequency of Communication with Friends and Family: Never    Frequency of Social Gatherings with Friends and Family: Never    Attends Religious Services: Never    Database Administrator or Organizations: No    Attends Engineer, Structural: 1 to 4 times per year    Marital Status: Never married  Intimate Partner Violence: Not At Risk (03/08/2024)   Humiliation, Afraid, Rape, and Kick questionnaire    Fear of Current or  Ex-Partner: No    Emotionally Abused: No    Physically Abused: No    Sexually Abused: No    Review of Systems: All negative except as stated above in HPI.  Physical Exam: Vital signs: Vitals:   03/11/24 0605 03/11/24 0733  BP: 128/78   Pulse: 91   Resp: 19   Temp: 98.1 F (36.7 C)   SpO2: 92% 92%   Last BM Date : 03/10/24 General:   Alert,  Well-developed, well-nourished, pleasant and cooperative in NAD, oxygen by nasal cannula Lungs: Decreased breath sounds bilaterally Heart:  Regular rate and rhythm; no murmurs, clicks, rubs,  or gallops. Abdomen: Soft, mild epigastric discomfort on palpation, bowel sound present, no peritoneal signs, mood and affect normal Alert and oriented x 3 Rectal:  Deferred  GI:  Lab Results: Recent Labs    03/09/24 0427 03/10/24 0427 03/11/24 0432  WBC 13.0* 10.6* 9.0  HGB 12.7* 12.9* 13.2  HCT 38.3* 39.0 40.2  PLT 288 310 307   BMET Recent Labs    03/09/24 0427 03/10/24 0427 03/11/24 0432  NA 137 137 135  K 3.6 3.9 4.1  CL 103 103 99  CO2 24 24 25   GLUCOSE 110* 97 93  BUN 34* 23 21  CREATININE 0.95 0.95 0.92  CALCIUM 9.4 9.0 8.8*   LFT Recent Labs    03/11/24 0432  ALBUMIN 3.7   PT/INR No results for input(s): LABPROT, INR in the last 72 hours.   Studies/Results: No results found.  Impression/Plan: -Nausea and vomiting for last 2 days in a patient with acute hypoxic respiratory failure and COPD exacerbation.  History of intermittent choking sensation and dysphagia for last few months. ?  Etiology.  Esophageal stricture remains in differential. -Acute hypoxic respiratory failure with acute COPD exacerbation - Severe hypothyroidism.  TSH of 28.  Recommendations -------------------------- - Follow CT abdomen pelvis with IV contrast which has been ordered by hospitalist.  If CT scan negative, recommend barium swallow for further evaluation of his nausea and vomiting. - Would hold off on endoscopic evaluation now  with acute hypoxic failure and acute COPD exacerbation. - Start IV Protonix  - Change diet to soft diet. - GI will follow    LOS: 2 days   Layla Lah  MD, FACP 03/11/2024, 11:14 AM  Contact #  [phone]

## 2024-03-11 NOTE — Telephone Encounter (Signed)
 Scheduled to see Dr Theophilus 12/17 with Dr Theophilus. This appointment will be printed on his discharge papers. Unable to send letter.

## 2024-03-11 NOTE — Consult Note (Signed)
 NAME:  Jimmy [name], MRN:  [id number], DOB:  1955-08-12, LOS: 2 ADMISSION DATE:  03/08/2024, CONSULTATION DATE:  03/11/24 REFERRING MD:  Dr. Sherrill, CHIEF COMPLAINT:  dyspnea   History of Present Illness:   Jimmy [name] is a 68 y/o homeless M with a PMH significant for hypothyroidism and emphysema who presents for productive cough and dyspnea in the setting of COPD exacerbation. Pulmonology consulted for evaluation and management of the latter.  He has been experiencing chest pain and difficulty breathing, with phlegm getting caught in his throat, leading to choking. This episode became severe on Sunday morning, resulting in an ambulance being called due to his inability to breathe and dizziness. He has been admitted to the hospital for the past three days.  He has a history of COPD and has been experiencing shortness of breath for several months, possibly years. He recalls being told last year that his thyroid  gland collapsed. He has not been previously diagnosed with emphysema but has a history of smoking since the age of 38 or [age], currently smoking a pack of cigarettes every three to four days. He has not used inhalers or seen a pulmonologist outside the hospital.  He denies any previous hospitalizations for breathing issues except when his thyroid  gland collapsed. He reports no prior episodes of bronchitis flare-ups. He has not had a breathing test before and does not take any medications at home, including thyroid  medications.  He lives in an abandoned building and is primarily homeless, maintaining the area in exchange for shelter. He has Medicaid, which allows him access to medications. He has not received a flu or pneumonia shot this year.  No recent exposure to sick individuals and no sore throat, runny nose, or earaches. No family history of lung problems, although his mother, who was on oxygen, might have died of lung cancer at age 29. He has not used cocaine or heroin in the past  three to four months.  No allergies. Coughing sometimes leads to sweating. He has not worked in environments with asbestos or mold and keeps his living area clean.  Pertinent  Medical History  Hypothyroidism COPD/Emphysema  Significant Hospital Events: Including procedures, antibiotic start and stop dates in addition to other pertinent events   12/7 --> admitted, RVP negative, started on bronchodilators, mucolytics, ICS/LAMA/LABA, macrolide antibiotic and prednisone  12/8 --> allergic rxn to azithromycin , discontinued azithromycin , and started doxycycline   Interim History / Subjective:  As above  Objective    Blood pressure 128/78, pulse 91, temperature 98.1 F (36.7 C), temperature source Oral, resp. rate 19, height 5' 2 (1.575 m), weight 52.2 kg, SpO2 91%.        Intake/Output Summary (Last 24 hours) at 03/11/2024 1151 Last data filed at 03/11/2024 0941 Gross per 24 hour  Intake 340 ml  Output 800 ml  Net -460 ml   Filed Weights   03/08/24 0314 03/08/24 0934  Weight: 53.5 kg 52.2 kg    Examination: General: disheveled appearing man, not in any distress, coughing intermittently HENT: anicteric sclera, well injected conjunctivae, oral and nasal mucosa intact Lungs: rhonchorous breath sounds bilaterally, + clubbing Cardiovascular: Distant heart sounds, no murmurs, no edema Abdomen: soft, non-tender Extremities: no edema Neuro: A&O x 3 GU: deferred  Resolved problem list   Assessment and Plan   #COPD exacerbation with emphysema #COPD Class E, Unknown Grade due to lack of PFTs #Acute Hypoxic Respiratory Failure Acute COPD exacerbation with emphysema, severe dyspnea, wheezing, productive cough with hemoptysis. Long-term smoking history. Discussed  long-term oxygen therapy, limited by homelessness. CXR reviewed independently and shows evidence of hyperinflation suggestive of COPD. - Duonebs QID - Hypertonic saline QID - Chest PT - Prescribed Breztri  inhaler, two  puffs morning and evening, rinse mouth after use. - Prescribed albuterol  inhaler as needed every four hours for dyspnea, wheezing, or coughing. - Arranged pulmonary follow-up for outpatient PFTs for formal diagnosis. - Discussed nebulizer machine with albuterol  solution, considering homelessness. - Advised pulmonary rehabilitation, acknowledging psychosocial limitations upon discharge - Will need oxygen walk test prior to discharge  #Tobacco dependence Long-standing tobacco dependence, 60-year history. Reduced smoking to a pack every three to four days. Advised cessation to prevent COPD exacerbations. - Advised smoking cessation to prevent recurrent COPD exacerbations. - Recommended annual low-dose CT for lung cancer screening.  Patient Lines/Drains/Airways Status     Active Line/Drains/Airways     Name Placement date Placement time Site Days   Peripheral IV 03/10/24 20 G Anterior;Right Forearm 03/10/24  1451  Forearm  1            Labs   CBC: Recent Labs  Lab 03/08/24 0611 03/09/24 0427 03/10/24 0427 03/11/24 0432  WBC 18.8* 13.0* 10.6* 9.0  NEUTROABS  --   --   --  7.0  HGB 13.3 12.7* 12.9* 13.2  HCT 39.0 38.3* 39.0 40.2  MCV 94.4 96.2 96.5 96.9  PLT 290 288 310 307    Basic Metabolic Panel: Recent Labs  Lab 03/08/24 0611 03/09/24 0427 03/10/24 0427 03/11/24 0432  NA 134* 137 137 135  K 4.1 3.6 3.9 4.1  CL 98 103 103 99  CO2 22 24 24 25   GLUCOSE 104* 110* 97 93  BUN 35* 34* 23 21  CREATININE 1.15 0.95 0.95 0.92  CALCIUM 9.3 9.4 9.0 8.8*  MG  --   --   --  2.3  PHOS  --   --   --  3.7   GFR: Estimated Creatinine Clearance: 56.7 mL/min (by C-G formula based on SCr of 0.92 mg/dL). Recent Labs  Lab 03/08/24 0611 03/09/24 0427 03/10/24 0427 03/11/24 0432  WBC 18.8* 13.0* 10.6* 9.0    Liver Function Tests: Recent Labs  Lab 03/11/24 0432  ALBUMIN 3.7   No results for input(s): LIPASE, AMYLASE in the last 168 hours. No results for input(s):  AMMONIA in the last 168 hours.  ABG No results found for: PHART, PCO2ART, PO2ART, HCO3, TCO2, ACIDBASEDEF, O2SAT   Coagulation Profile: No results for input(s): INR, PROTIME in the last 168 hours.  Cardiac Enzymes: No results for input(s): CKTOTAL, CKMB, CKMBINDEX, TROPONINI in the last 168 hours.  HbA1C: Hgb A1c MFr Bld  Date/Time Value Ref Range Status  03/10/2022 08:30 AM 5.8 (H) 4.8 - 5.6 % Final    Comment:    (NOTE)         Prediabetes: 5.7 - 6.4         Diabetes: >6.4         Glycemic control for adults with diabetes: <7.0     CBG: No results for input(s): GLUCAP in the last 168 hours.  Review of Systems:   Not obtained  Past Medical History:  He,  has no past medical history on file.   Surgical History:   Past Surgical History:  Procedure Laterality Date   APPENDECTOMY       Social History:   reports that he has been smoking cigarettes. He has never used smokeless tobacco. He reports that he does not drink alcohol  and does not use drugs.   Family History:  His family history is negative for CAD.   Allergies Allergies  Allergen Reactions   Azithromycin      reddening in the eye and cheek area, treated with Benadryl  and IV Pepcid       Home Medications  Prior to Admission medications   Not on File     Thank you for this interesting consult. I have spent 60 minutes evaluating patient, reviewing chart, and discussing plan of care with patient, family, and primary medical team. If you have any questions or concerns please reach out to me via secure chat.  Paula Southerly, MD [facility]

## 2024-03-11 NOTE — Plan of Care (Signed)

## 2024-03-11 NOTE — Telephone Encounter (Signed)
 Needs initial evaluation in pulmonary office for suspected COPD.

## 2024-03-12 ENCOUNTER — Other Ambulatory Visit (HOSPITAL_COMMUNITY): Payer: Self-pay

## 2024-03-12 ENCOUNTER — Inpatient Hospital Stay (HOSPITAL_COMMUNITY): Payer: MEDICAID

## 2024-03-12 LAB — MRSA NEXT GEN BY PCR, NASAL: MRSA by PCR Next Gen: NOT DETECTED

## 2024-03-12 LAB — CBC WITH DIFFERENTIAL/PLATELET
Abs Immature Granulocytes: 0.09 K/uL — ABNORMAL HIGH (ref 0.00–0.07)
Basophils Absolute: 0 K/uL (ref 0.0–0.1)
Basophils Relative: 0 %
Eosinophils Absolute: 0 K/uL (ref 0.0–0.5)
Eosinophils Relative: 0 %
HCT: 40 % (ref 39.0–52.0)
Hemoglobin: 13.3 g/dL (ref 13.0–17.0)
Immature Granulocytes: 1 %
Lymphocytes Relative: 10 %
Lymphs Abs: 0.9 K/uL (ref 0.7–4.0)
MCH: 31.7 pg (ref 26.0–34.0)
MCHC: 33.3 g/dL (ref 30.0–36.0)
MCV: 95.5 fL (ref 80.0–100.0)
Monocytes Absolute: 1.1 K/uL — ABNORMAL HIGH (ref 0.1–1.0)
Monocytes Relative: 12 %
Neutro Abs: 6.8 K/uL (ref 1.7–7.7)
Neutrophils Relative %: 77 %
Platelets: 311 K/uL (ref 150–400)
RBC: 4.19 MIL/uL — ABNORMAL LOW (ref 4.22–5.81)
RDW: 13.1 % (ref 11.5–15.5)
WBC: 8.9 K/uL (ref 4.0–10.5)
nRBC: 0 % (ref 0.0–0.2)

## 2024-03-12 LAB — COMPREHENSIVE METABOLIC PANEL WITH GFR
ALT: 39 U/L (ref 0–44)
AST: 38 U/L (ref 15–41)
Albumin: 3.6 g/dL (ref 3.5–5.0)
Alkaline Phosphatase: 88 U/L (ref 38–126)
Anion gap: 10 (ref 5–15)
BUN: 26 mg/dL — ABNORMAL HIGH (ref 8–23)
CO2: 27 mmol/L (ref 22–32)
Calcium: 9 mg/dL (ref 8.9–10.3)
Chloride: 99 mmol/L (ref 98–111)
Creatinine, Ser: 0.99 mg/dL (ref 0.61–1.24)
GFR, Estimated: >60 mL/min (ref >60)
Glucose, Bld: 86 mg/dL (ref 70–99)
Potassium: 4 mmol/L (ref 3.5–5.1)
Sodium: 135 mmol/L (ref 135–145)
Total Bilirubin: 0.2 mg/dL (ref 0.0–1.2)
Total Protein: 7.2 g/dL (ref 6.5–8.1)

## 2024-03-12 LAB — MAGNESIUM: Magnesium: 2.4 mg/dL (ref 1.7–2.4)

## 2024-03-12 LAB — PHOSPHORUS: Phosphorus: 3.1 mg/dL (ref 2.5–4.6)

## 2024-03-12 MED ORDER — DOXYCYCLINE HYCLATE 100 MG PO TABS
100.0000 mg | ORAL_TABLET | Freq: Two times a day (BID) | ORAL | Status: DC
  Administered 2024-03-12 – 2024-03-14 (×5): 100 mg via ORAL
  Filled 2024-03-12 (×5): qty 1

## 2024-03-12 MED ORDER — SODIUM CHLORIDE 3 % IN NEBU
4.0000 mL | INHALATION_SOLUTION | Freq: Three times a day (TID) | RESPIRATORY_TRACT | Status: DC
  Administered 2024-03-12 – 2024-03-14 (×7): 4 mL via RESPIRATORY_TRACT
  Filled 2024-03-12 (×8): qty 4

## 2024-03-12 MED ORDER — IPRATROPIUM-ALBUTEROL 0.5-2.5 (3) MG/3ML IN SOLN
3.0000 mL | Freq: Three times a day (TID) | RESPIRATORY_TRACT | Status: DC
  Administered 2024-03-12 – 2024-03-14 (×7): 3 mL via RESPIRATORY_TRACT
  Filled 2024-03-12 (×7): qty 3

## 2024-03-12 MED ORDER — SODIUM CHLORIDE 0.9 % IV SOLN: INTRAVENOUS | Status: DC

## 2024-03-12 NOTE — Progress Notes (Signed)
 [facility] Gastroenterology Progress Note  Jimmy [name] 68 y.o. 09-05-1955  CC: Nausea, vomiting, dysphagia   Subjective: Patient seen and examined at bedside.  His difficulty breathing has improved.  He is down to 2 L oxygen now.  He continues to have intermittent coughing and phlegm production.  Continues to have nausea, vomiting and dysphagia.  ROS : Afebrile, negative for chest pain   Objective: Vital signs in last 24 hours: Vitals:   03/11/24 2051 03/12/24 0927  BP: 122/82   Pulse: 89   Resp: 18   Temp: 97.8 F (36.6 C)   SpO2: 94% 94%    Physical Exam: Resting comfortably, not in acute distress.  Decreased breath sounds bilaterally.  Abdominal exam benign.  Mood and affect normal.  Alert and oriented x 3.   Lab Results: Recent Labs    03/11/24 0432 03/12/24 0439  NA 135 135  K 4.1 4.0  CL 99 99  CO2 25 27  GLUCOSE 93 86  BUN 21 26*  CREATININE 0.92 0.99  CALCIUM 8.8* 9.0  MG 2.3 2.4  PHOS 3.7 3.1   Recent Labs    03/11/24 0432 03/12/24 0439  AST  --  38  ALT  --  39  ALKPHOS  --  88  BILITOT  --  0.2  PROT  --  7.2  ALBUMIN 3.7 3.6   Recent Labs    03/11/24 0432 03/12/24 0439  WBC 9.0 8.9  NEUTROABS 7.0 6.8  HGB 13.2 13.3  HCT 40.2 40.0  MCV 96.9 95.5  PLT 307 311   No results for input(s): LABPROT, INR in the last 72 hours.    Assessment/Plan: -Nausea and vomiting for last 2 days in a patient with acute hypoxic respiratory failure and COPD exacerbation.  History of intermittent choking sensation and dysphagia for last few months. ?  Etiology.  Esophageal stricture remains in differential. -Acute hypoxic respiratory failure with acute COPD exacerbation - Severe hypothyroidism.  TSH of 28.   Recommendations -------------------------- - Patient underwent CT chest without contrast and CT abdomen pelvis with IV contrast which showed no acute GI changes.  Normal-appearing esophagus on CT chest.  -Patient's respiratory status has improved.   He is down to 2 L of oxygen.  Given his ongoing issues with swallowing, recommend EGD with possible dilation tomorrow.  - Discussed with hospitalist. - Keep n.p.o. past midnight.  Risks (bleeding, infection, bowel perforation that could require surgery, sedation-related changes in cardiopulmonary systems), benefits (identification and possible treatment of source of symptoms, exclusion of certain causes of symptoms), and alternatives (watchful waiting, radiographic imaging studies, empiric medical treatment)  were explained to patient/family in detail and patient wishes to proceed.    Layla Lah MD, FACP 03/12/2024, 9:36 AM  Contact #  [phone]

## 2024-03-12 NOTE — H&P (View-Only) (Signed)
 [facility] Gastroenterology Progress Note  Hubbard Seldon 68 y.o. 09-05-1955  CC: Nausea, vomiting, dysphagia   Subjective: Patient seen and examined at bedside.  His difficulty breathing has improved.  He is down to 2 L oxygen now.  He continues to have intermittent coughing and phlegm production.  Continues to have nausea, vomiting and dysphagia.  ROS : Afebrile, negative for chest pain   Objective: Vital signs in last 24 hours: Vitals:   03/11/24 2051 03/12/24 0927  BP: 122/82   Pulse: 89   Resp: 18   Temp: 97.8 F (36.6 C)   SpO2: 94% 94%    Physical Exam: Resting comfortably, not in acute distress.  Decreased breath sounds bilaterally.  Abdominal exam benign.  Mood and affect normal.  Alert and oriented x 3.   Lab Results: Recent Labs    03/11/24 0432 03/12/24 0439  NA 135 135  K 4.1 4.0  CL 99 99  CO2 25 27  GLUCOSE 93 86  BUN 21 26*  CREATININE 0.92 0.99  CALCIUM 8.8* 9.0  MG 2.3 2.4  PHOS 3.7 3.1   Recent Labs    03/11/24 0432 03/12/24 0439  AST  --  38  ALT  --  39  ALKPHOS  --  88  BILITOT  --  0.2  PROT  --  7.2  ALBUMIN 3.7 3.6   Recent Labs    03/11/24 0432 03/12/24 0439  WBC 9.0 8.9  NEUTROABS 7.0 6.8  HGB 13.2 13.3  HCT 40.2 40.0  MCV 96.9 95.5  PLT 307 311   No results for input(s): LABPROT, INR in the last 72 hours.    Assessment/Plan: -Nausea and vomiting for last 2 days in a patient with acute hypoxic respiratory failure and COPD exacerbation.  History of intermittent choking sensation and dysphagia for last few months. ?  Etiology.  Esophageal stricture remains in differential. -Acute hypoxic respiratory failure with acute COPD exacerbation - Severe hypothyroidism.  TSH of 28.   Recommendations -------------------------- - Patient underwent CT chest without contrast and CT abdomen pelvis with IV contrast which showed no acute GI changes.  Normal-appearing esophagus on CT chest.  -Patient's respiratory status has improved.   He is down to 2 L of oxygen.  Given his ongoing issues with swallowing, recommend EGD with possible dilation tomorrow.  - Discussed with hospitalist. - Keep n.p.o. past midnight.  Risks (bleeding, infection, bowel perforation that could require surgery, sedation-related changes in cardiopulmonary systems), benefits (identification and possible treatment of source of symptoms, exclusion of certain causes of symptoms), and alternatives (watchful waiting, radiographic imaging studies, empiric medical treatment)  were explained to patient/family in detail and patient wishes to proceed.    Layla Lah MD, FACP 03/12/2024, 9:36 AM  Contact #  [phone]

## 2024-03-12 NOTE — Progress Notes (Signed)
 NAME:  Jimmy [name], MRN:  [id number], DOB:  [birth year], LOS: 3 ADMISSION DATE:  03/08/2024, CONSULTATION DATE:  03/12/2024 REFERRING MD:  Dr. Sherrill, CHIEF COMPLAINT:  dyspnea   History of Present Illness:  Mr. Jimmy [name] is a 68 y/o homeless M with a PMH significant for hypothyroidism and emphysema who presents for productive cough and dyspnea in the setting of COPD exacerbation. Pulmonology consulted for evaluation and management of the latter.  He has been experiencing chest pain and difficulty breathing, with phlegm getting caught in his throat, leading to choking. This episode became severe on Sunday morning, resulting in an ambulance being called due to his inability to breathe and dizziness. He has been admitted to the hospital for the past three days.  He has a history of COPD and has been experiencing shortness of breath for several months, possibly years. He recalls being told last year that his thyroid  gland collapsed. He has not been previously diagnosed with emphysema but has a history of smoking since the age of 72 or [age], currently smoking a pack of cigarettes every three to four days. He has not used inhalers or seen a pulmonologist outside the hospital.  He denies any previous hospitalizations for breathing issues except when his thyroid  gland collapsed. He reports no prior episodes of bronchitis flare-ups. He has not had a breathing test before and does not take any medications at home, including thyroid  medications.  He lives in an abandoned building and is primarily homeless, maintaining the area in exchange for shelter. He has Medicaid, which allows him access to medications. He has not received a flu or pneumonia shot this year.  No recent exposure to sick individuals and no sore throat, runny nose, or earaches. No family history of lung problems, although his mother, who was on oxygen, might have died of lung cancer at age 58. He has not used cocaine or heroin in the past  three to four months.  No allergies. Coughing sometimes leads to sweating. He has not worked in environments with asbestos or mold and keeps his living area clean.  Pertinent  Medical History  Hypothyroidism COPD/Emphysema  Significant Hospital Events: Including procedures, antibiotic start and stop dates in addition to other pertinent events   12/7 --> admitted, RVP negative, started on bronchodilators, mucolytics, ICS/LAMA/LABA, macrolide antibiotic and prednisone  12/8 --> allergic rxn to azithromycin , discontinued azithromycin , and started doxycycline  12/10 --> CT chest completed and reviewed independently by me. He has tree in bud nodularities diffusely concerning for atypical pneumonia or viral pneumonitis. He has extensive emphysema as well. No masses or lesions concerning for malignancy.  Interim History / Subjective:  The patient notes he had a rough night with coughing thick sputum that is difficult to expectorate.  Objective    Blood pressure 122/82, pulse 89, temperature 97.8 F (36.6 C), resp. rate 18, height 5' 2 (1.575 m), weight 52.2 kg, SpO2 94%.        Intake/Output Summary (Last 24 hours) at 03/12/2024 1035 Last data filed at 03/12/2024 0856 Gross per 24 hour  Intake 340 ml  Output 900 ml  Net -560 ml   Filed Weights   03/08/24 0314 03/08/24 0934  Weight: 53.5 kg 52.2 kg    Examination: General: NAD, alert, WD, WN Eyes: PERRL, no scleral icterus ENMT: oropharynx clear, good dentition, no oral lesions, mallampati score I Skin: warm, intact, no rashes Neck: JVD flat CV: RRR, no MRG, nl S1 and S2, no peripheral edema Resp: inspiratory crackles that are  scattered, rhonchi improved Neuro: Awake alert oriented to person place time and situation  Resolved problem list   Assessment and Plan   #COPD exacerbation with emphysema #COPD Class E, Unknown Grade due to lack of PFTs #Acute Hypoxic Respiratory Failure #Community Acquired Pneumonia Acute COPD  exacerbation with emphysema, severe dyspnea, wheezing, productive cough with hemoptysis. Long-term smoking history. CT chest WO contrast shows tree in bud nodularities in all lobes which is concerning for atypical bacterial infection or viral pneumonitis. RVP negative. MRSA swab not collected. Sputum culture not collected. Patient on CAP coverage. - Agree with CAP coverage; added on doxycycline  to cover atypicals. - Obtain MRSA swab and sputum culture - Duonebs QID - Hypertonic saline QID - Chest PT - Prescribed Breztri  inhaler, two puffs morning and evening, rinse mouth after use. - Prescribed albuterol  inhaler as needed every four hours for dyspnea, wheezing, or coughing. - Arranged pulmonary follow-up for outpatient PFTs for formal diagnosis. - Discussed nebulizer machine with albuterol  solution, considering homelessness. - Advised pulmonary rehabilitation, acknowledging psychosocial limitations upon discharge - Will need oxygen walk test prior to discharge  #Tobacco dependence Long-standing tobacco dependence, 60-year history. Reduced smoking to a pack every three to four days. Advised cessation to prevent COPD exacerbations. - Advised smoking cessation to prevent recurrent COPD exacerbations. - Recommended annual low-dose CT for lung cancer screening.  Patient Lines/Drains/Airways Status     Active Line/Drains/Airways     Name Placement date Placement time Site Days   Peripheral IV 03/12/24 20 G Left;Posterior;Proximal Forearm 03/12/24  1014  Forearm  less than 1            Labs   CBC: Recent Labs  Lab 03/08/24 0611 03/09/24 0427 03/10/24 0427 03/11/24 0432 03/12/24 0439  WBC 18.8* 13.0* 10.6* 9.0 8.9  NEUTROABS  --   --   --  7.0 6.8  HGB 13.3 12.7* 12.9* 13.2 13.3  HCT 39.0 38.3* 39.0 40.2 40.0  MCV 94.4 96.2 96.5 96.9 95.5  PLT 290 288 310 307 311    Basic Metabolic Panel: Recent Labs  Lab 03/08/24 0611 03/09/24 0427 03/10/24 0427 03/11/24 0432  03/12/24 0439  NA 134* 137 137 135 135  K 4.1 3.6 3.9 4.1 4.0  CL 98 103 103 99 99  CO2 22 24 24 25 27   GLUCOSE 104* 110* 97 93 86  BUN 35* 34* 23 21 26*  CREATININE 1.15 0.95 0.95 0.92 0.99  CALCIUM 9.3 9.4 9.0 8.8* 9.0  MG  --   --   --  2.3 2.4  PHOS  --   --   --  3.7 3.1   GFR: Estimated Creatinine Clearance: 52.7 mL/min (by C-G formula based on SCr of 0.99 mg/dL). Recent Labs  Lab 03/09/24 0427 03/10/24 0427 03/11/24 0432 03/12/24 0439  WBC 13.0* 10.6* 9.0 8.9    Liver Function Tests: Recent Labs  Lab 03/11/24 0432 03/12/24 0439  AST  --  38  ALT  --  39  ALKPHOS  --  88  BILITOT  --  0.2  PROT  --  7.2  ALBUMIN 3.7 3.6   No results for input(s): LIPASE, AMYLASE in the last 168 hours. No results for input(s): AMMONIA in the last 168 hours.  ABG No results found for: PHART, PCO2ART, PO2ART, HCO3, TCO2, ACIDBASEDEF, O2SAT   Coagulation Profile: No results for input(s): INR, PROTIME in the last 168 hours.  Cardiac Enzymes: No results for input(s): CKTOTAL, CKMB, CKMBINDEX, TROPONINI in the last 168 hours.  HbA1C: Hgb A1c MFr Bld  Date/Time Value Ref Range Status  03/10/2022 08:30 AM 5.8 (H) 4.8 - 5.6 % Final    Comment:    (NOTE)         Prediabetes: 5.7 - 6.4         Diabetes: >6.4         Glycemic control for adults with diabetes: <7.0     CBG: No results for input(s): GLUCAP in the last 168 hours.  Review of Systems:   Not obtained  Past Medical History:  He,  has no past medical history on file.   Surgical History:   Past Surgical History:  Procedure Laterality Date   APPENDECTOMY       Social History:   reports that he has been smoking cigarettes. He has never used smokeless tobacco. He reports that he does not drink alcohol and does not use drugs.   Family History:  His family history is negative for CAD.   Allergies Allergies  Allergen Reactions   Azithromycin      reddening in the eye and  cheek area, treated with Benadryl  and IV Pepcid       Home Medications  Prior to Admission medications   Not on File     Thank you for this interesting consult. I have spent 35 minutes evaluating patient, reviewing chart, and discussing plan of care with patient, family, and primary medical team. If you have any questions or concerns please reach out to me via secure chat.  Paula Southerly, MD [facility]

## 2024-03-12 NOTE — Progress Notes (Signed)
 PROGRESS NOTE    Jimmy [name]  FMW:[id number] DOB: [birth year] DOA: 03/08/2024 PCP: Pcp, No   Brief Narrative:  Patient 68 year old gentleman history of homelessness, hypothyroidism, emphysema admitted to the hospital with a productive cough felt likely secondary to acute COPD exacerbation.  Hospitalization has been complicated by nausea vomiting and dysphagia and GI has been consulted as well as Pulmonary.  Pulmonary is adjusting his medications and added doxycycline  given atypical infection.  GI is planning for an EGD with possible dilation 03/13/24.  Assessment and Plan:  Acute Hypoxic Respiratory failure secondary to acute COPD Exacerbation: Patient noted not to be on O2 at baseline not on any scheduled medications. With history of emphysema noted to have previously been hospitalized with an acute COPD exacerbation. -Patient on admission with sats of 86% on room air. Chest x-ray done on admission with hyperinflated and hyperlucent lungs, suggestive of underlying COPD.  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 Influenza A/B, SARS coronavirus 2, and RSV by PCR negative. -Patient placed on scheduled DuoNebs, prednisone . -Continue Pulmicort , Flonase , Mucinex , Claritin , PPI. -Patient noted to have been placed on azithromycin  on admission however the evening of 03/08/2024, due to concerns for possible reaction to antibiotics azithromycin  discontinued and patient received a dose of Benadryl  and Pepcid  with clinical improvement. -Continue IV Rocephin  and Doxycycline  added by Pulmonary for concern for atypical infection or viral pneumonitis  -Supportive care. -Repeat CXR 12/10 showed Mildly progressive bronchitic changes superimposed on COPD.  -Pulmonary Consulted for further evaluation and recommendations and recommended obtaining outpatient PFTs for formal diagnosis.  They are also recommending continue DuoNebs 4 times daily, hypertonic saline nebs 4 times daily, chest PT and prescribing  Breztri  inhaler at discharge as well as an albuterol  inhaler as needed.  They have advised the patient to go to pulmonary rehabilitation acknowledging his psychosocial limitations and recommending an Ambulatory home O2 screen prior to discharge. -Pulmonary also obtaining MRSA swab and sputum Cx -CT Chest done and showed Bilateral bronchial wall thickening, with scattered tree in bud nodular airspace disease within the right upper, right lower, and left lower lobes. The appearance is most consistent with infection and bilateral bronchopneumonia, though sequela of inflammation or aspiration could give this pattern as well. Aortic Atherosclerosis. Stable emphysema.  ?? Allergic reaction: The night before last it is noted that there was a concern for reaction to azithromycin  as patient had reported face reddening in the eye and cheek area which did not look similar early on in the shift. Patient given Benadryl  and IV Pepcid  with clinical improvement  Discontinued azithromycin .  Patient noted to have tolerated doxycycline  during last hospitalization and received a dose of IV Rocephin  on presentation. Placed on IV Rocephin  secondary to problem #1. Discontinue Pepcid .   Dysphagia: Per admitting physician patient has described a several month history of choking sensation intermittently with p.o. intake. SLP consulted. Having N/V so will obtain CT Abd/Pelvis and obtain GI evaluation.  -GI recommends to follow CT scan of the abdomen pelvis and if it is negative recommending a barium swallow to further evaluate his nausea/vomiting and dysphagia. -Diet had been changed to SOFT and he has been started on IV PPI with Protonix .   -Given his continued swallowing issues GI is recommending EGD with possible dilation now that he is down to 2 L of oxygen.  Patient to be n.p.o. at midnight   Hypothyroidism: Patient during last hospitalization was noted to be hypothyroid and started on Synthroid  during the hospitalization.  Patient noted to have been  discharged on Synthroid  50 mcg daily. Patient states not on any medications prior to admission. TSH obtained at 28.7. -Continue Levothyroxine  50 mcg daily that was resumed on admission.  Patient will need outpatient follow-up with PCP for repeat TFTs to be done in 4 to 6 weeks.    Leukocytosis: Improved and resolved. Likely reactive leukocytosis as patient with no evidence of an acute infection in the setting of steroids. WBC went from 13.0 -> 10.6 -> 9.0 -> 8.9. Chest x-ray done negative for any acute infiltrate. Patient afebrile. Patient with no urinary symptoms. Leukocytosis trending down. Having N/V so obtained CT Abd/Pelvis which showed Large right inguinal hernia containing a segment of small bowel. No evidence of incarceration or bowel obstruction. Trace pelvic free fluid, likely reactive.  Patchy right lower lobe airspace disease, consistent with inflammatory or infectious etiology. Aortic  Atherosclerosis  Tobacco Abuse: Has a longstanding tobacco dependence and history of smoking.  The pulmonologist recommending low-dose annual CT scan screening; CT Scan of Chest showed stable Emphysema    DVT prophylaxis: enoxaparin  (LOVENOX ) injection 40 mg Start: 03/08/24 1000    Code Status: Full Code Family Communication: No family present @ bedside   Disposition Plan:  Level of care: Med-Surg Status is: Inpatient Remains inpatient appropriate because: Needs further clinical improvement and clearance by the specialists   Consultants:  Gastroenterology Pulmonary  Procedures:  As delineated as above  Antimicrobials:  Anti-infectives (From admission, onward)    Start     Dose/Rate Route Frequency Ordered Stop   03/12/24 1130  doxycycline  (VIBRA -TABS) tablet 100 mg        100 mg Oral Every 12 hours 03/12/24 1039 03/15/24 0959   03/09/24 1000  doxycycline  (VIBRA -TABS) tablet 100 mg  Status:  Discontinued        100 mg Oral Every 12 hours 03/09/24 0857 03/09/24  0858   03/09/24 1000  cefTRIAXone  (ROCEPHIN ) 2 g in sodium chloride  0.9 % 100 mL IVPB        2 g 200 mL/hr over 30 Minutes Intravenous Every 24 hours 03/09/24 0858 03/12/24 1037   03/08/24 0900  azithromycin  (ZITHROMAX ) 500 mg in sodium chloride  0.9 % 250 mL IVPB  Status:  Discontinued        500 mg 250 mL/hr over 60 Minutes Intravenous Every 24 hours 03/08/24 0839 03/09/24 0858   03/08/24 0815  azithromycin  (ZITHROMAX ) 500 mg in sodium chloride  0.9 % 250 mL IVPB  Status:  Discontinued        500 mg 250 mL/hr over 60 Minutes Intravenous  Once 03/08/24 0807 03/08/24 0839   03/08/24 0815  cefTRIAXone  (ROCEPHIN ) 1 g in sodium chloride  0.9 % 100 mL IVPB        1 g 200 mL/hr over 30 Minutes Intravenous  Once 03/08/24 0807 03/08/24 9096       Subjective: Seen and examined at bedside and states that he is still having a little trouble breathing but not as bad but had a rough night.  Not as short of breath and continues to have some intermittent coughing and phlegm production.  Continues to state that he has some nausea vomiting and dysphagia.  Objective: Vitals:   03/11/24 2051 03/12/24 0927 03/12/24 1136 03/12/24 1345  BP: 122/82  (!) 143/79   Pulse: 89  92   Resp: 18     Temp: 97.8 F (36.6 C)  97.7 F (36.5 C)   TempSrc:      SpO2: 94% 94% (!) 89% 92%  Weight:  Height:        Intake/Output Summary (Last 24 hours) at 03/12/2024 1740 Last data filed at 03/12/2024 0856 Gross per 24 hour  Intake 340 ml  Output 650 ml  Net -310 ml   Filed Weights   03/08/24 0314 03/08/24 0934  Weight: 53.5 kg 52.2 kg   Examination: Physical Exam:  Constitutional: Chronically ill-appearing Caucasian male who appears older than his stated age in some mild respiratory distress Respiratory: Diminished to auscultation bilaterally with some coarse breath sounds and some slight rhonchi and wheezing but no appreciable rales or crackles. Normal respiratory effort and patient is not tachypenic. No  accessory muscle use.  Unlabored breathing but is wearing supplemental oxygen nasal cannula Cardiovascular: RRR, no murmurs / rubs / gallops. S1 and S2 auscultated. No extremity edema. Abdomen: Soft, non-tender, non-distended. Bowel sounds positive.  GU: Deferred. Musculoskeletal: No clubbing / cyanosis of digits/nails. No joint deformity upper and lower extremities. Skin: No rashes, lesions, ulcers on a limited skin evaluation. No induration; Warm and dry.  Neurologic: CN 2-12 grossly intact with no focal deficits. Romberg sign and cerebellar reflexes not assessed.  Psychiatric: Appears calm  Data Reviewed: I have personally reviewed following labs and imaging studies  CBC: Recent Labs  Lab 03/08/24 0611 03/09/24 0427 03/10/24 0427 03/11/24 0432 03/12/24 0439  WBC 18.8* 13.0* 10.6* 9.0 8.9  NEUTROABS  --   --   --  7.0 6.8  HGB 13.3 12.7* 12.9* 13.2 13.3  HCT 39.0 38.3* 39.0 40.2 40.0  MCV 94.4 96.2 96.5 96.9 95.5  PLT 290 288 310 307 311   Basic Metabolic Panel: Recent Labs  Lab 03/08/24 0611 03/09/24 0427 03/10/24 0427 03/11/24 0432 03/12/24 0439  NA 134* 137 137 135 135  K 4.1 3.6 3.9 4.1 4.0  CL 98 103 103 99 99  CO2 22 24 24 25 27   GLUCOSE 104* 110* 97 93 86  BUN 35* 34* 23 21 26*  CREATININE 1.15 0.95 0.95 0.92 0.99  CALCIUM 9.3 9.4 9.0 8.8* 9.0  MG  --   --   --  2.3 2.4  PHOS  --   --   --  3.7 3.1   GFR: Estimated Creatinine Clearance: 52.7 mL/min (by C-G formula based on SCr of 0.99 mg/dL). Liver Function Tests: Recent Labs  Lab 03/11/24 0432 03/12/24 0439  AST  --  38  ALT  --  39  ALKPHOS  --  88  BILITOT  --  0.2  PROT  --  7.2  ALBUMIN 3.7 3.6   No results for input(s): LIPASE, AMYLASE in the last 168 hours. No results for input(s): AMMONIA in the last 168 hours. Coagulation Profile: No results for input(s): INR, PROTIME in the last 168 hours. Cardiac Enzymes: No results for input(s): CKTOTAL, CKMB, CKMBINDEX, TROPONINI  in the last 168 hours. BNP (last 3 results) No results for input(s): PROBNP in the last 8760 hours. HbA1C: No results for input(s): HGBA1C in the last 72 hours. CBG: No results for input(s): GLUCAP in the last 168 hours. Lipid Profile: No results for input(s): CHOL, HDL, LDLCALC, TRIG, CHOLHDL, LDLDIRECT in the last 72 hours. Thyroid  Function Tests: No results for input(s): TSH, T4TOTAL, FREET4, T3FREE, THYROIDAB in the last 72 hours. Anemia Panel: No results for input(s): VITAMINB12, FOLATE, FERRITIN, TIBC, IRON, RETICCTPCT in the last 72 hours. Sepsis Labs: No results for input(s): PROCALCITON, LATICACIDVEN in the last 168 hours.  Recent Results (from the past 240 hours)  Resp panel by  RT-PCR (RSV, Flu A&B, Covid) Anterior Nasal Swab     Status: None   Collection Time: 03/08/24  3:21 AM   Specimen: Anterior Nasal Swab  Result Value Ref Range Status   SARS Coronavirus 2 by RT PCR NEGATIVE NEGATIVE Final    Comment: (NOTE) SARS-CoV-2 target nucleic acids are NOT DETECTED.  The SARS-CoV-2 RNA is generally detectable in upper respiratory specimens during the acute phase of infection. The lowest concentration of SARS-CoV-2 viral copies this assay can detect is 138 copies/mL. A negative result does not preclude SARS-Cov-2 infection and should not be used as the sole basis for treatment or other patient management decisions. A negative result may occur with  improper specimen collection/handling, submission of specimen other than nasopharyngeal swab, presence of viral mutation(s) within the areas targeted by this assay, and inadequate number of viral copies(<138 copies/mL). A negative result must be combined with clinical observations, patient history, and epidemiological information. The expected result is Negative.  Fact Sheet for Patients:  bloggercourse.com  Fact Sheet for Healthcare Providers:   seriousbroker.it  This test is no t yet approved or cleared by the [place]  FDA and  has been authorized for detection and/or diagnosis of SARS-CoV-2 by FDA under an Emergency Use Authorization (EUA). This EUA will remain  in effect (meaning this test can be used) for the duration of the COVID-19 declaration under Section 564(b)(1) of the Act, 21 U.S.C.section 360bbb-3(b)(1), unless the authorization is terminated  or revoked sooner.       Influenza A by PCR NEGATIVE NEGATIVE Final   Influenza B by PCR NEGATIVE NEGATIVE Final    Comment: (NOTE) The Xpert Xpress SARS-CoV-2/FLU/RSV plus assay is intended as an aid in the diagnosis of influenza from Nasopharyngeal swab specimens and should not be used as a sole basis for treatment. Nasal washings and aspirates are unacceptable for Xpert Xpress SARS-CoV-2/FLU/RSV testing.  Fact Sheet for Patients: bloggercourse.com  Fact Sheet for Healthcare Providers: seriousbroker.it  This test is not yet approved or cleared by the [place]  FDA and has been authorized for detection and/or diagnosis of SARS-CoV-2 by FDA under an Emergency Use Authorization (EUA). This EUA will remain in effect (meaning this test can be used) for the duration of the COVID-19 declaration under Section 564(b)(1) of the Act, 21 U.S.C. section 360bbb-3(b)(1), unless the authorization is terminated or revoked.     Resp Syncytial Virus by PCR NEGATIVE NEGATIVE Final    Comment: (NOTE) Fact Sheet for Patients: bloggercourse.com  Fact Sheet for Healthcare Providers: seriousbroker.it  This test is not yet approved or cleared by the [place]  FDA and has been authorized for detection and/or diagnosis of SARS-CoV-2 by FDA under an Emergency Use Authorization (EUA). This EUA will remain in effect (meaning this test can be used) for  the duration of the COVID-19 declaration under Section 564(b)(1) of the Act, 21 U.S.C. section 360bbb-3(b)(1), unless the authorization is terminated or revoked.  Performed at [facility], [address] [place], [place] [address]     Radiology Studies: DG CHEST PORT 1 VIEW Result Date: 03/12/2024 CLINICAL DATA:  Shortness of breath. EXAM: PORTABLE CHEST 1 VIEW COMPARISON:  03/11/2024 FINDINGS: The lungs are clear without focal pneumonia, edema, pneumothorax or pleural effusion. Extreme right costophrenic angle not included on the film. Interstitial markings are diffusely coarsened with chronic features. The cardiopericardial silhouette is within normal limits for size. No acute bony abnormality. IMPRESSION: Chronic interstitial coarsening without acute cardiopulmonary findings. Electronically Signed   By:  Camellia Candle M.D.   On: 03/12/2024 07:27   CT CHEST WO CONTRAST Result Date: 03/11/2024 CLINICAL DATA:  Dyspnea, chronic, unclear etiology EXAM: CT CHEST WITHOUT CONTRAST TECHNIQUE: Multidetector CT imaging of the chest was performed following the standard protocol without IV contrast. RADIATION DOSE REDUCTION: This exam was performed according to the departmental dose-optimization program which includes automated exposure control, adjustment of the mA and/or kV according to patient size and/or use of iterative reconstruction technique. COMPARISON:  03/11/2024, 04/19/2022 FINDINGS: Cardiovascular: Unenhanced imaging of the heart is unremarkable without significant pericardial effusion. Normal caliber of the thoracic aorta. Atherosclerosis of the aorta and coronary vasculature. Mediastinum/Nodes: No enlarged mediastinal or axillary lymph nodes. Thyroid  gland, trachea, and esophagus demonstrate no significant findings. Lungs/Pleura: Scattered tree in bud nodular airspace disease is identified within the posterior aspect of the right upper lobe and dependent bilateral lower  lobes, consistent with sequela of infection, inflammation, or aspiration. Mild upper lobe predominant emphysema. No effusion or pneumothorax. Mild bilateral bronchial wall thickening. Upper Abdomen: No acute abnormality. Musculoskeletal: No acute or destructive bony abnormalities. Reconstructed images demonstrate no additional findings. IMPRESSION: 1. Bilateral bronchial wall thickening, with scattered tree in bud nodular airspace disease within the right upper, right lower, and left lower lobes. The appearance is most consistent with infection and bilateral bronchopneumonia, though sequela of inflammation or aspiration could give this pattern as well. 2.  Aortic Atherosclerosis (ICD10-I70.0). 3. Stable emphysema. Pulmonary emphysema is an independent risk factor for lung cancer. Recommend patient be considered for lung cancer screening. [place]  Chief Financial Officer. Screening for Lung Cancer: [place]  Licensed Conveyancer. JAMA. 2021;325(10):962-970. Electronically Signed   By: Ozell Daring M.D.   On: 03/11/2024 19:37   CT ABDOMEN PELVIS W CONTRAST Result Date: 03/11/2024 CLINICAL DATA:  Nausea and vomiting EXAM: CT ABDOMEN AND PELVIS WITH CONTRAST TECHNIQUE: Multidetector CT imaging of the abdomen and pelvis was performed using the standard protocol following bolus administration of intravenous contrast. RADIATION DOSE REDUCTION: This exam was performed according to the departmental dose-optimization program which includes automated exposure control, adjustment of the mA and/or kV according to patient size and/or use of iterative reconstruction technique. CONTRAST:  OMNIPAQUE  IOHEXOL  300 MG/ML  SOLN COMPARISON:  03/11/2024 FINDINGS: Lower chest: There is patchy right lower lobe airspace disease which may reflect inflammatory or infectious etiology. No pleural effusion. Hepatobiliary: No focal liver abnormality is seen. No gallstones, gallbladder wall thickening, or  biliary dilatation. Pancreas: Unremarkable. No pancreatic ductal dilatation or surrounding inflammatory changes. Spleen: Normal in size without focal abnormality. Adrenals/Urinary Tract: Low-attenuation bilateral adrenal thickening consistent with hyperplasia. No focal adrenal abnormalities. Kidneys enhance normally. No urinary tract calculi or obstructive uropathy. Bladder is unremarkable. Stomach/Bowel: No bowel obstruction or ileus. Normal appendix right lower quadrant. Moderate stool throughout the colon. No bowel wall thickening or inflammatory change. There is a segment of small bowel contained within a large right inguinal hernia, with no evidence of incarceration or obstruction. Vascular/Lymphatic: Choose 2 Reproductive: Mild prostatomegaly. Other: Trace pelvic free fluid. No free intraperitoneal gas. There is a large right inguinal hernia, with herniated small bowel extending into the right hemiscrotum. No evidence of incarceration or obstruction. Musculoskeletal: No acute or destructive bony abnormalities. Reconstructed images demonstrate no additional findings. IMPRESSION: 1. Large right inguinal hernia containing a segment of small bowel. No evidence of incarceration or bowel obstruction. 2. Trace pelvic free fluid, likely reactive. 3. Patchy right lower lobe airspace disease, consistent with inflammatory or infectious etiology. 4.  Aortic Atherosclerosis (ICD10-I70.0). Electronically Signed   By: Ozell Daring M.D.   On: 03/11/2024 19:34   DG CHEST PORT 1 VIEW Result Date: 03/11/2024 CLINICAL DATA:  Shortness of breath. EXAM: PORTABLE CHEST 1 VIEW COMPARISON:  03/08/2024 FINDINGS: Normal-sized heart. Clear lungs with normal vascularity. Mild peribronchial thickening. The lungs remain mildly hyperexpanded. Thoracolumbar spine degenerative changes and old, healed right rib fractures. IMPRESSION: Mildly progressive bronchitic changes superimposed on COPD. Electronically Signed   By: Elspeth Bathe M.D.    On: 03/11/2024 13:31   Scheduled Meds:  budesonide -glycopyrrolate -formoterol   2 puff Inhalation BID   doxycycline   100 mg Oral Q12H   enoxaparin  (LOVENOX ) injection  40 mg Subcutaneous Q24H   fluticasone   2 spray Each Nare Daily   guaiFENesin   1,200 mg Oral BID   Influenza vac split trivalent PF  0.5 mL Intramuscular Tomorrow-1000   ipratropium-albuterol   3 mL Nebulization TID   levothyroxine   50 mcg Oral Q0600   loratadine   10 mg Oral Daily   nicotine   14 mg Transdermal Daily   pantoprazole  (PROTONIX ) IV  40 mg Intravenous Q12H   pneumococcal 20-valent conjugate vaccine  0.5 mL Intramuscular Tomorrow-1000   predniSONE   40 mg Oral Q breakfast   sodium chloride  HYPERTONIC  4 mL Nebulization TID   Continuous Infusions:  sodium chloride       LOS: 3 days   Alejandro Marker, DO [facility] Available via Epic secure chat 7am-7pm After these hours, please refer to coverage provider listed on [email or web address] 03/12/2024, 5:40 PM

## 2024-03-12 NOTE — Plan of Care (Signed)

## 2024-03-12 NOTE — Progress Notes (Signed)
 Mobility Specialist - Progress Note  [place] 2 L Pre-mobility: 90% SpO2 During mobility: 124/83 mmHg BP, 88-89% SpO2 Post-mobility: 93% SPO2 (after 2 minutes)   03/12/24 1130  Mobility  Activity Dangled on edge of bed;Stood at bedside  Level of Assistance Minimal assist, patient does 75% or more  Range of Motion/Exercises Active  Activity Response Tolerated fair  Mobility Referral Yes  Mobility visit 1 Mobility  Mobility Specialist Start Time (ACUTE ONLY) 1106  Mobility Specialist Stop Time (ACUTE ONLY) 1119  Mobility Specialist Time Calculation (min) (ACUTE ONLY) 13 min   Pt was received in bed and agreed to mobility. Min A sit to stand. Pt stated dizziness when standing, returned to edge of bed. Checked BP as dizziness subsided. Opted for bed mobility exercises: Seated BLE Exercises:  1) Toe Raise/ Heel Raise: 1 x 5 each leg  Pt stated pain in legs during session. Deferred further mobility. Returned to bed with all needs met. Call bell in reach.  [employer] - Mobility Specialist

## 2024-03-13 ENCOUNTER — Inpatient Hospital Stay (HOSPITAL_COMMUNITY): Payer: MEDICAID

## 2024-03-13 ENCOUNTER — Encounter (HOSPITAL_COMMUNITY): Payer: Self-pay | Admitting: Internal Medicine

## 2024-03-13 ENCOUNTER — Inpatient Hospital Stay (HOSPITAL_COMMUNITY): Payer: MEDICAID | Admitting: Anesthesiology

## 2024-03-13 ENCOUNTER — Encounter (HOSPITAL_COMMUNITY): Admission: EM | Disposition: A | Payer: Self-pay | Source: Home / Self Care | Attending: Orthopedic Surgery

## 2024-03-13 HISTORY — PX: ESOPHAGOGASTRODUODENOSCOPY: SHX5428

## 2024-03-13 LAB — CBC WITH DIFFERENTIAL/PLATELET
Abs Immature Granulocytes: 0.16 K/uL — ABNORMAL HIGH (ref 0.00–0.07)
Basophils Absolute: 0 K/uL (ref 0.0–0.1)
Basophils Relative: 1 %
Eosinophils Absolute: 0.1 K/uL (ref 0.0–0.5)
Eosinophils Relative: 1 %
HCT: 44.1 % (ref 39.0–52.0)
Hemoglobin: 14.4 g/dL (ref 13.0–17.0)
Immature Granulocytes: 2 %
Lymphocytes Relative: 16 %
Lymphs Abs: 1.1 K/uL (ref 0.7–4.0)
MCH: 31.9 pg (ref 26.0–34.0)
MCHC: 32.7 g/dL (ref 30.0–36.0)
MCV: 97.6 fL (ref 80.0–100.0)
Monocytes Absolute: 0.9 K/uL (ref 0.1–1.0)
Monocytes Relative: 13 %
Neutro Abs: 4.7 K/uL (ref 1.7–7.7)
Neutrophils Relative %: 67 %
Platelets: 290 K/uL (ref 150–400)
RBC: 4.52 MIL/uL (ref 4.22–5.81)
RDW: 13.2 % (ref 11.5–15.5)
WBC: 7 K/uL (ref 4.0–10.5)
nRBC: 0 % (ref 0.0–0.2)

## 2024-03-13 LAB — EXPECTORATED SPUTUM ASSESSMENT W GRAM STAIN, RFLX TO RESP C

## 2024-03-13 LAB — COMPREHENSIVE METABOLIC PANEL WITH GFR
ALT: 32 U/L (ref 0–44)
AST: 30 U/L (ref 15–41)
Albumin: 3.7 g/dL (ref 3.5–5.0)
Alkaline Phosphatase: 87 U/L (ref 38–126)
Anion gap: 9 (ref 5–15)
BUN: 28 mg/dL — ABNORMAL HIGH (ref 8–23)
CO2: 28 mmol/L (ref 22–32)
Calcium: 9.1 mg/dL (ref 8.9–10.3)
Chloride: 99 mmol/L (ref 98–111)
Creatinine, Ser: 0.91 mg/dL (ref 0.61–1.24)
GFR, Estimated: >60 mL/min (ref >60)
Glucose, Bld: 93 mg/dL (ref 70–99)
Potassium: 3.8 mmol/L (ref 3.5–5.1)
Sodium: 136 mmol/L (ref 135–145)
Total Bilirubin: 0.2 mg/dL (ref 0.0–1.2)
Total Protein: 7.4 g/dL (ref 6.5–8.1)

## 2024-03-13 LAB — MAGNESIUM: Magnesium: 2.1 mg/dL (ref 1.7–2.4)

## 2024-03-13 LAB — PHOSPHORUS: Phosphorus: 3 mg/dL (ref 2.5–4.6)

## 2024-03-13 SURGERY — EGD (ESOPHAGOGASTRODUODENOSCOPY)
Anesthesia: Monitor Anesthesia Care

## 2024-03-13 MED ORDER — PHENYLEPHRINE 80 MCG/ML (10ML) SYRINGE FOR IV PUSH (FOR BLOOD PRESSURE SUPPORT)
PREFILLED_SYRINGE | INTRAVENOUS | Status: DC | PRN
  Administered 2024-03-13: 240 ug via INTRAVENOUS
  Administered 2024-03-13: 80 ug via INTRAVENOUS
  Administered 2024-03-13: 240 ug via INTRAVENOUS
  Administered 2024-03-13: 80 ug via INTRAVENOUS
  Administered 2024-03-13: 160 ug via INTRAVENOUS

## 2024-03-13 MED ORDER — PROPOFOL 500 MG/50ML IV EMUL
INTRAVENOUS | Status: DC | PRN
  Administered 2024-03-13: 200 ug/kg/min via INTRAVENOUS

## 2024-03-13 MED ADMIN — PROPOFOL 200 MG/20ML IV EMUL: 20 mg | INTRAVENOUS | NDC 00069023401

## 2024-03-13 MED ADMIN — Lidocaine HCl Local Soln Prefilled Syringe 100 MG/5ML (2%): 70 mg | INTRAVENOUS | NDC 70004072309

## 2024-03-13 NOTE — Anesthesia Procedure Notes (Signed)
 Procedure Name: MAC Date/Time: 03/13/2024 11:17 AM  Performed by: Franchot Delon RAMAN, CRNAPre-anesthesia Checklist: Patient identified, Emergency Drugs available, Suction available and Patient being monitored Oxygen Delivery Method: Simple face mask Airway Equipment and Method: Bite block Placement Confirmation: positive ETCO2 Dental Injury: Teeth and Oropharynx as per pre-operative assessment

## 2024-03-13 NOTE — Interval H&P Note (Signed)
 History and Physical Interval Note:  03/13/2024 11:13 AM  Jimmy [name]  has presented today for surgery, with the diagnosis of Nausea, vomiting, dysphagia.  The various methods of treatment have been discussed with the patient and family. After consideration of risks, benefits and other options for treatment, the patient has consented to  Procedures: EGD (ESOPHAGOGASTRODUODENOSCOPY) (N/A) as a surgical intervention.  The patient's history has been reviewed, patient examined, no change in status, stable for surgery.  I have reviewed the patient's chart and labs.  Questions were answered to the patient's satisfaction.     Korrie Hofbauer

## 2024-03-13 NOTE — Progress Notes (Signed)
 PROGRESS NOTE    Jimmy [name]  FMW:[id number] DOB: 1956/03/20 DOA: 03/08/2024 PCP: Pcp, No   Brief Narrative:  Patient 68 year old gentleman history of homelessness, hypothyroidism, emphysema admitted to the hospital with a productive cough felt likely secondary to acute COPD exacerbation.  Hospitalization has been complicated by nausea vomiting and dysphagia and GI has been consulted as well as Pulmonary.  Pulmonary is adjusting his medications and added doxycycline  given atypical infection.  GI took the patient for an EGD with dilation 03/13/24 and no esophageal abnormalities were found. Anticipating D/C in the next 24 hours.  Assessment and Plan:  Acute Hypoxic Respiratory failure secondary to acute COPD Exacerbation: Patient noted not to be on O2 at baseline not on any scheduled medications. With history of emphysema noted to have previously been hospitalized with an acute COPD exacerbation. -Patient on admission with sats of 86% on room air. Chest x-ray done on admission with hyperinflated and hyperlucent lungs, suggestive of underlying COPD.  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 Influenza A/B, SARS coronavirus 2, and RSV by PCR negative. -Patient placed on scheduled DuoNebs, prednisone . -Continue Pulmicort , Flonase , Mucinex , Claritin , PPI. -Patient noted to have been placed on azithromycin  on admission however the evening of 03/08/2024, due to concerns for possible reaction to antibiotics azithromycin  discontinued and patient received a dose of Benadryl  and Pepcid  with clinical improvement. -Continue IV Rocephin  and Doxycycline  added by Pulmonary for concern for atypical infection or viral pneumonitis  -Supportive care. -Repeat CXR 12/10 showed Mildly progressive bronchitic changes superimposed on COPD.  -Pulmonary Consulted for further evaluation and recommendations and recommended obtaining outpatient PFTs for formal diagnosis.  They are also recommending continue DuoNebs  4 times daily, hypertonic saline nebs 4 times daily, chest PT and prescribing Breztri  inhaler at discharge as well as an albuterol  inhaler as needed.  They have advised the patient to go to pulmonary rehabilitation acknowledging his psychosocial limitations and recommending an Ambulatory home O2 screen prior to discharge. -Pulmonary also obtaining MRSA swab and sputum Cx (pending) -CT Chest done and showed Bilateral bronchial wall thickening, with scattered tree in bud nodular airspace disease within the right upper, right lower, and left lower lobes. The appearance is most consistent with infection and bilateral bronchopneumonia, though sequela of inflammation or aspiration could give this pattern as well. Aortic Atherosclerosis. Stable emphysema. -Will check Ambulatory O2 Screen in the AM; Repeat CXR this AM is pending   ?? Allergic Reaction: The night before last it is noted that there was a concern for reaction to azithromycin  as patient had reported face reddening in the eye and cheek area which did not look similar early on in the shift. Patient given Benadryl  and IV Pepcid  with clinical improvement  Discontinued azithromycin .  Patient noted to have tolerated doxycycline  during last hospitalization and received a dose of IV Rocephin  on presentation. Placed on IV Rocephin  secondary to problem #1. Discontinue Pepcid .   Dysphagia: Per admitting physician patient has described a several month history of choking sensation intermittently with p.o. intake. SLP consulted. Having N/V so will obtain CT Abd/Pelvis and obtain GI evaluation.  -GI recommends to follow CT scan of the abdomen pelvis and if it is negative recommending a barium swallow to further evaluate his nausea/vomiting and dysphagia. -Diet had been changed to SOFT and he has been started on IV PPI with Protonix .   -Given his continued swallowing issues GI is recommending EGD and he underwent this today.  Patient had no esophageal abnormality  to explain his dysphagia  and his esophagus was dilated.  There is a medium amount of food in the stomach as residue and chronic gastritis that was biopsied with a normal duodenal bulb and the 1st and 2nd portion of the duodenum.   Hypothyroidism: Patient during last hospitalization was noted to be hypothyroid and started on Synthroid  during the hospitalization. Patient noted to have been discharged on Synthroid  50 mcg daily. Patient states not on any medications prior to admission. TSH obtained at 28.7. -Continue Levothyroxine  50 mcg daily that was resumed on admission.  Patient will need outpatient follow-up with PCP for repeat TFTs to be done in 4 to 6 weeks.    Leukocytosis: Improved and resolved. Likely reactive leukocytosis as patient with no evidence of an acute infection in the setting of steroids. WBC went from 13.0 -> 10.6 -> 9.0 -> 8.9 -> 7.0. Chest x-ray done negative for any acute infiltrate. Patient afebrile. Patient with no urinary symptoms. Leukocytosis trending down. Having N/V so obtained CT Abd/Pelvis which showed Large right inguinal hernia containing a segment of small bowel. No evidence of incarceration or bowel obstruction. Trace pelvic free fluid, likely reactive.  Patchy right lower lobe airspace disease, consistent with inflammatory or infectious etiology. Aortic  Atherosclerosis  Tobacco Abuse: Has a longstanding tobacco dependence and history of smoking.  The pulmonologist recommending low-dose annual CT scan screening; CT Scan of Chest showed stable Emphysema    DVT prophylaxis: enoxaparin  (LOVENOX ) injection 40 mg Start: 03/08/24 1000    Code Status: Full Code Family Communication: No family present @ bedside   Disposition Plan:  Level of care: Med-Surg Status is: Inpatient Remains inpatient appropriate because: Needs further clinical improvement and will need an Ambulatory O2 Screen in the AM   Consultants:  Pulmonary Gastroenterology  Procedures:   EGD  Antimicrobials:  Anti-infectives (From admission, onward)    Start     Dose/Rate Route Frequency Ordered Stop   03/12/24 1130  doxycycline  (VIBRA -TABS) tablet 100 mg        100 mg Oral Every 12 hours 03/12/24 1039 03/15/24 0959   03/09/24 1000  doxycycline  (VIBRA -TABS) tablet 100 mg  Status:  Discontinued        100 mg Oral Every 12 hours 03/09/24 0857 03/09/24 0858   03/09/24 1000  cefTRIAXone  (ROCEPHIN ) 2 g in sodium chloride  0.9 % 100 mL IVPB        2 g 200 mL/hr over 30 Minutes Intravenous Every 24 hours 03/09/24 0858 03/12/24 1037   03/08/24 0900  azithromycin  (ZITHROMAX ) 500 mg in sodium chloride  0.9 % 250 mL IVPB  Status:  Discontinued        500 mg 250 mL/hr over 60 Minutes Intravenous Every 24 hours 03/08/24 0839 03/09/24 0858   03/08/24 0815  azithromycin  (ZITHROMAX ) 500 mg in sodium chloride  0.9 % 250 mL IVPB  Status:  Discontinued        500 mg 250 mL/hr over 60 Minutes Intravenous  Once 03/08/24 0807 03/08/24 0839   03/08/24 0815  cefTRIAXone  (ROCEPHIN ) 1 g in sodium chloride  0.9 % 100 mL IVPB        1 g 200 mL/hr over 30 Minutes Intravenous  Once 03/08/24 9192 03/08/24 9096       Subjective: Seen and examined at bedside after his EGD and he thinks he is doing okay.  Was hungry and wanting something to eat.  No nausea or vomiting.  Denies lightheadedness or dizziness.  States his breathing is doing little bit better.  Objective: Vitals:  03/13/24 1150 03/13/24 1200 03/13/24 1239 03/13/24 1414  BP: (!) 140/84 (!) 144/71 132/80   Pulse: 88 90 81   Resp: (!) 33 (!) 22 18   Temp:   97.6 F (36.4 C)   TempSrc:   Oral   SpO2: 96% 92% 93% 93%  Weight:      Height:        Intake/Output Summary (Last 24 hours) at 03/13/2024 1912 Last data filed at 03/13/2024 1404 Gross per 24 hour  Intake 279.72 ml  Output 1500 ml  Net -1220.28 ml   Filed Weights   03/08/24 0314 03/08/24 0934  Weight: 53.5 kg 52.2 kg   Examination: Physical Exam:  Constitutional:  Chronically ill-appearing Caucasian male in no acute distress Respiratory: Diminished to auscultation bilaterally with some coarse breath sounds and has some slight rhonchi and mild wheezing but no appreciable rales or crackles. Normal respiratory effort and patient is not tachypenic. No accessory muscle use.  Unlabored breathing is wearing supplemental oxygen via nasal cannula Cardiovascular: RRR, no murmurs / rubs / gallops. S1 and S2 auscultated. No extremity edema.  Abdomen: Soft, non-tender, non-distended. Bowel sounds positive.  GU: Deferred. Musculoskeletal: No clubbing / cyanosis of digits/nails. No joint deformity upper and lower extremities.  Skin: No rashes, lesions, ulcers on limited skin evaluation. No induration; Warm and dry.  Neurologic: CN 2-12 grossly intact with no focal deficits. Romberg sign and cerebellar reflexes not assessed.  Psychiatric: Normal judgment and insight. Alert and oriented x 3. Appears calm  Data Reviewed: I have personally reviewed following labs and imaging studies  CBC: Recent Labs  Lab 03/09/24 0427 03/10/24 0427 03/11/24 0432 03/12/24 0439 03/13/24 1012  WBC 13.0* 10.6* 9.0 8.9 7.0  NEUTROABS  --   --  7.0 6.8 4.7  HGB 12.7* 12.9* 13.2 13.3 14.4  HCT 38.3* 39.0 40.2 40.0 44.1  MCV 96.2 96.5 96.9 95.5 97.6  PLT 288 310 307 311 290   Basic Metabolic Panel: Recent Labs  Lab 03/09/24 0427 03/10/24 0427 03/11/24 0432 03/12/24 0439 03/13/24 1012  NA 137 137 135 135 136  K 3.6 3.9 4.1 4.0 3.8  CL 103 103 99 99 99  CO2 24 24 25 27 28   GLUCOSE 110* 97 93 86 93  BUN 34* 23 21 26* 28*  CREATININE 0.95 0.95 0.92 0.99 0.91  CALCIUM 9.4 9.0 8.8* 9.0 9.1  MG  --   --  2.3 2.4 2.1  PHOS  --   --  3.7 3.1 3.0   GFR: Estimated Creatinine Clearance: 57.4 mL/min (by C-G formula based on SCr of 0.91 mg/dL). Liver Function Tests: Recent Labs  Lab 03/11/24 0432 03/12/24 0439 03/13/24 1012  AST  --  38 30  ALT  --  39 32  ALKPHOS  --  88 87   BILITOT  --  0.2 0.2  PROT  --  7.2 7.4  ALBUMIN 3.7 3.6 3.7   No results for input(s): LIPASE, AMYLASE in the last 168 hours. No results for input(s): AMMONIA in the last 168 hours. Coagulation Profile: No results for input(s): INR, PROTIME in the last 168 hours. Cardiac Enzymes: No results for input(s): CKTOTAL, CKMB, CKMBINDEX, TROPONINI in the last 168 hours. BNP (last 3 results) No results for input(s): PROBNP in the last 8760 hours. HbA1C: No results for input(s): HGBA1C in the last 72 hours. CBG: No results for input(s): GLUCAP in the last 168 hours. Lipid Profile: No results for input(s): CHOL, HDL, LDLCALC, TRIG, CHOLHDL, LDLDIRECT  in the last 72 hours. Thyroid  Function Tests: No results for input(s): TSH, T4TOTAL, FREET4, T3FREE, THYROIDAB in the last 72 hours. Anemia Panel: No results for input(s): VITAMINB12, FOLATE, FERRITIN, TIBC, IRON, RETICCTPCT in the last 72 hours. Sepsis Labs: No results for input(s): PROCALCITON, LATICACIDVEN in the last 168 hours.  Recent Results (from the past 240 hours)  Resp panel by RT-PCR (RSV, Flu A&B, Covid) Anterior Nasal Swab     Status: None   Collection Time: 03/08/24  3:21 AM   Specimen: Anterior Nasal Swab  Result Value Ref Range Status   SARS Coronavirus 2 by RT PCR NEGATIVE NEGATIVE Final    Comment: (NOTE) SARS-CoV-2 target nucleic acids are NOT DETECTED.  The SARS-CoV-2 RNA is generally detectable in upper respiratory specimens during the acute phase of infection. The lowest concentration of SARS-CoV-2 viral copies this assay can detect is 138 copies/mL. A negative result does not preclude SARS-Cov-2 infection and should not be used as the sole basis for treatment or other patient management decisions. A negative result may occur with  improper specimen collection/handling, submission of specimen other than nasopharyngeal swab, presence of viral mutation(s)  within the areas targeted by this assay, and inadequate number of viral copies(<138 copies/mL). A negative result must be combined with clinical observations, patient history, and epidemiological information. The expected result is Negative.  Fact Sheet for Patients:  bloggercourse.com  Fact Sheet for Healthcare Providers:  seriousbroker.it  This test is no t yet approved or cleared by the [place]  FDA and  has been authorized for detection and/or diagnosis of SARS-CoV-2 by FDA under an Emergency Use Authorization (EUA). This EUA will remain  in effect (meaning this test can be used) for the duration of the COVID-19 declaration under Section 564(b)(1) of the Act, 21 U.S.C.section 360bbb-3(b)(1), unless the authorization is terminated  or revoked sooner.       Influenza A by PCR NEGATIVE NEGATIVE Final   Influenza B by PCR NEGATIVE NEGATIVE Final    Comment: (NOTE) The Xpert Xpress SARS-CoV-2/FLU/RSV plus assay is intended as an aid in the diagnosis of influenza from Nasopharyngeal swab specimens and should not be used as a sole basis for treatment. Nasal washings and aspirates are unacceptable for Xpert Xpress SARS-CoV-2/FLU/RSV testing.  Fact Sheet for Patients: bloggercourse.com  Fact Sheet for Healthcare Providers: seriousbroker.it  This test is not yet approved or cleared by the [place]  FDA and has been authorized for detection and/or diagnosis of SARS-CoV-2 by FDA under an Emergency Use Authorization (EUA). This EUA will remain in effect (meaning this test can be used) for the duration of the COVID-19 declaration under Section 564(b)(1) of the Act, 21 U.S.C. section 360bbb-3(b)(1), unless the authorization is terminated or revoked.     Resp Syncytial Virus by PCR NEGATIVE NEGATIVE Final    Comment: (NOTE) Fact Sheet for  Patients: bloggercourse.com  Fact Sheet for Healthcare Providers: seriousbroker.it  This test is not yet approved or cleared by the [place]  FDA and has been authorized for detection and/or diagnosis of SARS-CoV-2 by FDA under an Emergency Use Authorization (EUA). This EUA will remain in effect (meaning this test can be used) for the duration of the COVID-19 declaration under Section 564(b)(1) of the Act, 21 U.S.C. section 360bbb-3(b)(1), unless the authorization is terminated or revoked.  Performed at [facility], [address], [place], [place] [address]   MRSA Next Gen by PCR, Nasal     Status: None   Collection Time: 03/12/24 11:05  AM   Specimen: Nasal Mucosa; Nasal Swab  Result Value Ref Range Status   MRSA by PCR Next Gen NOT DETECTED NOT DETECTED Final    Comment: (NOTE) The GeneXpert MRSA Assay (FDA approved for NASAL specimens only), is one component of a comprehensive MRSA colonization surveillance program. It is not intended to diagnose MRSA infection nor to guide or monitor treatment for MRSA infections. Test performance is not FDA approved in patients less than 106 years old. Performed at [facility], [address] [place], [place] [address]   Expectorated Sputum Assessment w Gram Stain, Rflx to Resp Cult     Status: None   Collection Time: 03/13/24  6:00 AM   Specimen: SPU  Result Value Ref Range Status   Specimen Description SPUTUM  Final   Special Requests NONE  Final   Sputum evaluation   Final    THIS SPECIMEN IS ACCEPTABLE FOR SPUTUM CULTURE Performed at [facility], [address], [place], [place] [address]    Report Status 03/13/2024 FINAL  Final  Culture, Respiratory w Gram Stain     Status: None (Preliminary result)   Collection Time: 03/13/24  6:00 AM   Specimen: SPU  Result Value Ref Range Status   Specimen Description   Final     SPUTUM Performed at [facility], [address] [place], [place] [address]    Special Requests   Final    NONE Reflexed from [id number] Performed at [facility], [address] [place], [place] [address]    Gram Stain   Final    FEW SQUAMOUS EPITHELIAL CELLS PRESENT ABUNDANT GRAM NEGATIVE RODS ABUNDANT GRAM POSITIVE COCCI WBC PRESENT, PREDOMINANTLY PMN Performed at [facility], [address], [place], [place] [address]    Culture PENDING  Incomplete   Report Status PENDING  Incomplete    Radiology Studies: DG CHEST PORT 1 VIEW Result Date: 03/12/2024 CLINICAL DATA:  Shortness of breath. EXAM: PORTABLE CHEST 1 VIEW COMPARISON:  03/11/2024 FINDINGS: The lungs are clear without focal pneumonia, edema, pneumothorax or pleural effusion. Extreme right costophrenic angle not included on the film. Interstitial markings are diffusely coarsened with chronic features. The cardiopericardial silhouette is within normal limits for size. No acute bony abnormality. IMPRESSION: Chronic interstitial coarsening without acute cardiopulmonary findings. Electronically Signed   By: Camellia Candle M.D.   On: 03/12/2024 07:27   Scheduled Meds:  budesonide -glycopyrrolate -formoterol   2 puff Inhalation BID   doxycycline   100 mg Oral Q12H   enoxaparin  (LOVENOX ) injection  40 mg Subcutaneous Q24H   fluticasone   2 spray Each Nare Daily   guaiFENesin   1,200 mg Oral BID   Influenza vac split trivalent PF  0.5 mL Intramuscular Tomorrow-1000   ipratropium-albuterol   3 mL Nebulization TID   levothyroxine   50 mcg Oral Q0600   loratadine   10 mg Oral Daily   nicotine   14 mg Transdermal Daily   pantoprazole  (PROTONIX ) IV  40 mg Intravenous Q12H   pneumococcal 20-valent conjugate vaccine  0.5 mL Intramuscular Tomorrow-1000   predniSONE   40 mg Oral Q breakfast   sodium chloride  HYPERTONIC  4 mL Nebulization TID   Continuous Infusions:   LOS: 4 days   Alejandro Marker,  DO [facility] Available via Epic secure chat 7am-7pm After these hours, please refer to coverage provider listed on [email or web address] 03/13/2024, 7:12 PM

## 2024-03-13 NOTE — Anesthesia Preprocedure Evaluation (Signed)
 Anesthesia Evaluation  Patient identified by MRN, date of birth, ID band Patient awake    Reviewed: Allergy & Precautions, H&P , NPO status , Patient's Chart, lab work & pertinent test results  Airway Mallampati: II   Neck ROM: full    Dental   Pulmonary COPD, Current Smoker and Patient abstained from smoking.   breath sounds clear to auscultation       Cardiovascular negative cardio ROS  Rhythm:regular Rate:Normal     Neuro/Psych    GI/Hepatic   Endo/Other    Renal/GU      Musculoskeletal   Abdominal   Peds  Hematology   Anesthesia Other Findings   Reproductive/Obstetrics                              Anesthesia Physical Anesthesia Plan  ASA: 2  Anesthesia Plan: MAC   Post-op Pain Management:    Induction: Intravenous  PONV Risk Score and Plan: 0 and Propofol infusion and Treatment may vary due to age or medical condition  Airway Management Planned: Nasal Cannula  Additional Equipment:   Intra-op Plan:   Post-operative Plan:   Informed Consent: I have reviewed the patients History and Physical, chart, labs and discussed the procedure including the risks, benefits and alternatives for the proposed anesthesia with the patient or authorized representative who has indicated his/her understanding and acceptance.     Dental advisory given  Plan Discussed with: CRNA, Anesthesiologist and Surgeon  Anesthesia Plan Comments:          Anesthesia Quick Evaluation

## 2024-03-13 NOTE — Op Note (Signed)
 [facility] Patient Name: Jimmy [name] Procedure Date: 03/13/2024 MRN: [id number] Attending MD: Layla Lah , MD, [id number] Date of Birth: Aug 21, 1955 CSN: [id number] Age: 68 Admit Type: Inpatient Procedure:                Upper GI endoscopy Indications:              Dysphagia, Nausea with vomiting Providers:                Layla Lah, MD, Jacquelyn Jaci Pierce, RN,                            Felice Sar, Technician Referring MD:              Medicines:                Sedation Administered by an Anesthesia Professional Complications:            No immediate complications. Estimated Blood Loss:     Estimated blood loss was minimal. Procedure:                Pre-Anesthesia Assessment:                           - Prior to the procedure, a History and Physical                            was performed, and patient medications and                            allergies were reviewed. The patient's tolerance of                            previous anesthesia was also reviewed. The risks                            and benefits of the procedure and the sedation                            options and risks were discussed with the patient.                            All questions were answered, and informed consent                            was obtained. Prior Anticoagulants: The patient has                            taken no anticoagulant or antiplatelet agents. ASA                            Grade Assessment: IV - A patient with severe                            systemic disease that is a constant threat to life.  After reviewing the risks and benefits, the patient                            was deemed in satisfactory condition to undergo the                            procedure.                           After obtaining informed consent, the endoscope was                            passed under direct vision. Throughout the                             procedure, the patient's blood pressure, pulse, and                            oxygen saturations were monitored continuously. The                            [id number]-[id number] ([id number]) Olympus endoscope was introduced                            through the mouth, and advanced to the second part                            of duodenum. The upper GI endoscopy was                            accomplished without difficulty. The patient                            tolerated the procedure well. Scope In: Scope Out: Findings:      The Z-line was regular and was found 38 cm from the incisors.      No endoscopic abnormality was evident in the esophagus to explain the       patient's complaint of dysphagia. It was decided, however, to proceed       with dilation of the entire esophagus. The scope was withdrawn. Dilation       was performed with a Maloney dilator with no resistance at 48 Fr. The       dilation site was examined following endoscope reinsertion and showed no       bleeding, mucosal tear or perforation.      A medium amount of food (residue) was found in the gastric body.      Diffuse moderate inflammation characterized by congestion (edema),       erosions and erythema was found in the entire examined stomach. Biopsies       were taken with a cold forceps for histology.      The cardia and gastric fundus were normal on retroflexion.      The duodenal bulb, first portion of the duodenum and second portion of       the duodenum were normal. Impression:               -  Z-line regular, 38 cm from the incisors.                           - No endoscopic esophageal abnormality to explain                            patient's dysphagia. Esophagus dilated. Dilated.                           - A medium amount of food (residue) in the stomach.                           - Chronic gastritis. Biopsied.                           - Normal duodenal bulb, first portion of the                             duodenum and second portion of the duodenum. Moderate Sedation:      Moderate (conscious) sedation was personally administered by an       anesthesia professional. The following parameters were monitored: oxygen       saturation, heart rate, blood pressure, and response to care. Recommendation:           - Return patient to hospital ward for ongoing care.                           - Soft diet.                           - Continue present medications.                           - Await pathology results. Procedure Code(s):        --- Professional ---                           [phone], Esophagogastroduodenoscopy, flexible,                            transoral; with biopsy, single or multiple                           43450, Dilation of esophagus, by unguided sound or                            bougie, single or multiple passes Diagnosis Code(s):        --- Professional ---                           R13.10, Dysphagia, unspecified                           K29.50, Unspecified chronic gastritis without                            bleeding  R11.2, Nausea with vomiting, unspecified CPT copyright 2022 American [employer]. All rights reserved. The codes documented in this report are preliminary and upon coder review may  be revised to meet current compliance requirements. Layla Lah, MD Layla Lah, MD 03/13/2024 11:50:14 AM Number of Addenda: 0

## 2024-03-13 NOTE — Plan of Care (Signed)

## 2024-03-13 NOTE — TOC Progression Note (Signed)
 Transition of Care [facility]) - Progression Note    Patient Details  Name: Kolten Ryback MRN: [id number] Date of Birth: October 19, 1955  [facility] CM/SW Contact  Shaye Elling, Glenys DASEN, RN   Clinical Narrative:    Patient off unit for procedure.   Expected Discharge Plan: Homeless Shelter Barriers to Discharge: Inadequate or no insurance, Homeless with medical needs, Continued Medical Work up               Expected Discharge Plan and Services In-house Referral: Artist, PCP / Production Designer, Theatre/television/film Services: CM Consult, Medication Assistance   Living arrangements for the past 2 months: No permanent address, Homeless                 DME Arranged: N/A DME Agency: NA       HH Arranged: NA HH Agency: NA         Social Drivers of Health (SDOH) Interventions SDOH Screenings   Food Insecurity: Food Insecurity Present (03/08/2024)  Housing: High Risk (03/08/2024)  Transportation Needs: Unmet Transportation Needs (03/08/2024)  Utilities: At Risk (03/08/2024)  Social Connections: Socially Isolated (03/08/2024)  Tobacco Use: High Risk (03/13/2024)    Readmission Risk Interventions     No data to display

## 2024-03-13 NOTE — Anesthesia Postprocedure Evaluation (Signed)
 Anesthesia Post Note  Patient: Jimmy [name]  Procedure(s) Performed: EGD (ESOPHAGOGASTRODUODENOSCOPY)     Patient location during evaluation: Endoscopy Anesthesia Type: MAC Level of consciousness: awake and alert Pain management: pain level controlled Vital Signs Assessment: post-procedure vital signs reviewed and stable Respiratory status: spontaneous breathing, nonlabored ventilation, respiratory function stable and patient connected to nasal cannula oxygen Cardiovascular status: stable and blood pressure returned to baseline Postop Assessment: no apparent nausea or vomiting Anesthetic complications: no   No notable events documented.  Last Vitals:  Vitals:   03/13/24 1239 03/13/24 1414  BP: 132/80   Pulse: 81   Resp: 18   Temp: 36.4 C   SpO2: 93% 93%    Last Pain:  Vitals:   03/13/24 1239  TempSrc: Oral  PainSc:                  Akshat Minehart S

## 2024-03-13 NOTE — Plan of Care (Signed)

## 2024-03-13 NOTE — Transfer of Care (Signed)
 Immediate Anesthesia Transfer of Care Note  Patient: Jimmy [name]  Procedure(s) Performed: EGD (ESOPHAGOGASTRODUODENOSCOPY)  Patient Location: PACU  Anesthesia Type:MAC  Level of Consciousness: awake and patient cooperative  Airway & Oxygen Therapy: Patient Spontanous Breathing and Patient connected to face mask oxygen  Post-op Assessment: Report given to RN and Post -op Vital signs reviewed and stable  Post vital signs: Reviewed and stable  Last Vitals:  Vitals Value Taken Time  BP 118/50 03/13/24 11:42  Temp    Pulse 100 03/13/24 11:44  Resp 23 03/13/24 11:44  SpO2 99 % 03/13/24 11:44  Vitals shown include unfiled device data.  Last Pain:  Vitals:   03/13/24 1038  TempSrc: Temporal  PainSc: 5       Patients Stated Pain Goal: 0 (03/11/24 0925)  Complications: No notable events documented.

## 2024-03-14 ENCOUNTER — Other Ambulatory Visit (HOSPITAL_COMMUNITY): Payer: Self-pay

## 2024-03-14 ENCOUNTER — Inpatient Hospital Stay (HOSPITAL_COMMUNITY): Payer: MEDICAID

## 2024-03-14 LAB — CBC WITH DIFFERENTIAL/PLATELET
Abs Immature Granulocytes: 0.17 K/uL — ABNORMAL HIGH (ref 0.00–0.07)
Basophils Absolute: 0.1 K/uL (ref 0.0–0.1)
Basophils Relative: 1 %
Eosinophils Absolute: 0.1 K/uL (ref 0.0–0.5)
Eosinophils Relative: 1 %
HCT: 41.4 % (ref 39.0–52.0)
Hemoglobin: 13.6 g/dL (ref 13.0–17.0)
Immature Granulocytes: 2 %
Lymphocytes Relative: 28 %
Lymphs Abs: 2 K/uL (ref 0.7–4.0)
MCH: 31.6 pg (ref 26.0–34.0)
MCHC: 32.9 g/dL (ref 30.0–36.0)
MCV: 96.1 fL (ref 80.0–100.0)
Monocytes Absolute: 0.9 K/uL (ref 0.1–1.0)
Monocytes Relative: 13 %
Neutro Abs: 4 K/uL (ref 1.7–7.7)
Neutrophils Relative %: 55 %
Platelets: 298 K/uL (ref 150–400)
RBC: 4.31 MIL/uL (ref 4.22–5.81)
RDW: 13 % (ref 11.5–15.5)
WBC: 7.1 K/uL (ref 4.0–10.5)
nRBC: 0 % (ref 0.0–0.2)

## 2024-03-14 LAB — COMPREHENSIVE METABOLIC PANEL WITH GFR
ALT: 29 U/L (ref 0–44)
AST: 28 U/L (ref 15–41)
Albumin: 3.5 g/dL (ref 3.5–5.0)
Alkaline Phosphatase: 77 U/L (ref 38–126)
Anion gap: 10 (ref 5–15)
BUN: 29 mg/dL — ABNORMAL HIGH (ref 8–23)
CO2: 26 mmol/L (ref 22–32)
Calcium: 8.9 mg/dL (ref 8.9–10.3)
Chloride: 100 mmol/L (ref 98–111)
Creatinine, Ser: 0.85 mg/dL (ref 0.61–1.24)
GFR, Estimated: >60 mL/min (ref >60)
Glucose, Bld: 88 mg/dL (ref 70–99)
Potassium: 4.1 mmol/L (ref 3.5–5.1)
Sodium: 135 mmol/L (ref 135–145)
Total Bilirubin: 0.3 mg/dL (ref 0.0–1.2)
Total Protein: 7 g/dL (ref 6.5–8.1)

## 2024-03-14 LAB — MAGNESIUM: Magnesium: 2.1 mg/dL (ref 1.7–2.4)

## 2024-03-14 LAB — PHOSPHORUS: Phosphorus: 3.2 mg/dL (ref 2.5–4.6)

## 2024-03-14 MED ORDER — LEVOTHYROXINE SODIUM 50 MCG PO TABS
50.0000 ug | ORAL_TABLET | Freq: Every day | ORAL | 0 refills | Status: AC
  Filled 2024-03-14: qty 30, 30d supply, fill #0

## 2024-03-14 MED ORDER — SALINE SPRAY 0.65 % NA SOLN
1.0000 | NASAL | 0 refills | Status: AC | PRN
  Filled 2024-03-14: qty 44, 30d supply, fill #0

## 2024-03-14 MED ORDER — FLUTICASONE PROPIONATE 50 MCG/ACT NA SUSP
2.0000 | Freq: Every day | NASAL | 0 refills | Status: AC
  Filled 2024-03-14: qty 16, 30d supply, fill #0

## 2024-03-14 MED ORDER — ONDANSETRON HCL 4 MG PO TABS
4.0000 mg | ORAL_TABLET | Freq: Four times a day (QID) | ORAL | 0 refills | Status: AC | PRN
  Filled 2024-03-14: qty 20, 5d supply, fill #0

## 2024-03-14 MED ORDER — GUAIFENESIN ER 600 MG PO TB12
600.0000 mg | ORAL_TABLET | Freq: Two times a day (BID) | ORAL | 0 refills | Status: AC
  Filled 2024-03-14: qty 10, 5d supply, fill #0

## 2024-03-14 MED ORDER — PREDNISONE 10 MG PO TABS
ORAL_TABLET | ORAL | 0 refills | Status: AC
  Filled 2024-03-14: qty 21, 6d supply, fill #0

## 2024-03-14 MED ORDER — LORATADINE 10 MG PO TABS
10.0000 mg | ORAL_TABLET | Freq: Every day | ORAL | 0 refills | Status: AC
  Filled 2024-03-14: qty 30, 30d supply, fill #0

## 2024-03-14 MED ORDER — OXYCODONE HCL 5 MG PO TABS: 5.0000 mg | ORAL_TABLET | Freq: Four times a day (QID) | ORAL | Status: DC | PRN

## 2024-03-14 MED ORDER — HYDROCOD POLI-CHLORPHE POLI ER 10-8 MG/5ML PO SUER
5.0000 mL | Freq: Two times a day (BID) | ORAL | 0 refills | Status: AC | PRN
  Filled 2024-03-14: qty 70, 7d supply, fill #0

## 2024-03-14 MED ORDER — PANTOPRAZOLE SODIUM 40 MG PO TBEC
40.0000 mg | DELAYED_RELEASE_TABLET | Freq: Every day | ORAL | 0 refills | Status: AC
  Filled 2024-03-14: qty 30, 30d supply, fill #0

## 2024-03-14 MED ORDER — TRAZODONE HCL 50 MG PO TABS
25.0000 mg | ORAL_TABLET | Freq: Every evening | ORAL | 0 refills | Status: AC | PRN
  Filled 2024-03-14: qty 15, 30d supply, fill #0

## 2024-03-14 MED ORDER — DOXYCYCLINE HYCLATE 100 MG PO TABS
100.0000 mg | ORAL_TABLET | Freq: Two times a day (BID) | ORAL | 0 refills | Status: AC
  Filled 2024-03-14: qty 6, 3d supply, fill #0

## 2024-03-14 MED ORDER — ACETAMINOPHEN 325 MG PO TABS
650.0000 mg | ORAL_TABLET | Freq: Four times a day (QID) | ORAL | 0 refills | Status: AC | PRN
  Filled 2024-03-14: qty 20, 3d supply, fill #0

## 2024-03-14 MED ORDER — PHENOL 1.4 % MT LIQD
1.0000 | OROMUCOSAL | 0 refills | Status: AC | PRN
  Filled 2024-03-14: qty 177, 30d supply, fill #0

## 2024-03-14 MED ORDER — NICOTINE 14 MG/24HR TD PT24
14.0000 mg | MEDICATED_PATCH | Freq: Every day | TRANSDERMAL | 0 refills | Status: AC
  Filled 2024-03-14: qty 14, 14d supply, fill #0

## 2024-03-14 MED ORDER — BUDESON-GLYCOPYRROL-FORMOTEROL 160-9-4.8 MCG/ACT IN AERO
2.0000 | INHALATION_SPRAY | Freq: Two times a day (BID) | RESPIRATORY_TRACT | 0 refills | Status: AC
  Filled 2024-03-14: qty 10.7, 30d supply, fill #0

## 2024-03-14 MED ORDER — ALBUTEROL SULFATE HFA 108 (90 BASE) MCG/ACT IN AERS
1.0000 | INHALATION_SPRAY | Freq: Four times a day (QID) | RESPIRATORY_TRACT | 2 refills | Status: AC | PRN
  Filled 2024-03-14: qty 6.7, 30d supply, fill #0

## 2024-03-14 NOTE — TOC Transition Note (Signed)
 Transition of Care [facility]) - Discharge Note   Patient Details  Name: Jimmy [name] MRN: [id number] Date of Birth: 1956-02-12  Transition of Care [facility]) CM/SW Contact:  Sonda Manuella Quill, RN Phone Number: 03/14/2024, 3:18 PM   Clinical Narrative:    D/C orders received; MATCH given;pt requested bus pass; bus pass given to [place]cee, [facility]; resource also given for [facility]; pt verbalized understanding that he will make his own appt w/ agency of choice no IP CM needs.   Final next level of care: Home/Self Care Barriers to Discharge: No Barriers Identified   Patient Goals and CMS Choice Patient states their goals for this hospitalization and ongoing recovery are:: Living in an abandone building on [employer].gov Compare Post Acute Care list provided to:: Patient Choice offered to / list presented to : Patient [place]ts ownership interest in [facility].provided to:: Patient    Discharge Placement                       Discharge Plan and Services Additional resources added to the After Visit Summary for   In-house Referral: Artist, PCP / Health Connect Discharge Planning Services: MATCH Program, CM Consult            DME Arranged: N/A DME Agency: NA       HH Arranged: NA HH Agency: NA        Social Drivers of Health (SDOH) Interventions SDOH Screenings   Food Insecurity: Food Insecurity Present (03/08/2024)  Housing: High Risk (03/08/2024)  Transportation Needs: Unmet Transportation Needs (03/08/2024)  Utilities: At Risk (03/08/2024)  Social Connections: Socially Isolated (03/08/2024)  Tobacco Use: High Risk (03/13/2024)     Readmission Risk Interventions     No data to display

## 2024-03-14 NOTE — Plan of Care (Signed)

## 2024-03-14 NOTE — TOC Progression Note (Signed)
 Transition of Care [facility]) - Progression Note    Patient Details  Name: Jimmy [name] MRN: [id number] Date of Birth: 03-26-1956  Transition of Care [facility]) CM/SW Contact  Sonda Manuella Quill, RN Phone Number: 03/14/2024, 3:11 PM  Clinical Narrative:    Beatris w/ pt in room; pt said he does not have insurance, and he cannot afford to pay for medications; explained MATCH; explained to pt he cannot receive another MATCH for 12 months; he verbalized understanding; pt given copy of MATCH letter; [facility] notified.  MATCH MEDICATION ASSISTANCE CARD Pharmacies please call [phone] for claim processing assistance.  Rx BIN: [id number] Rx Group: [id number] Rx PCN: [id number] Relationship Code: 1 Person Code: [id number]  Patient ID (MRN):  [id number]    Patient Name: Jimmy [name], Jimmy [name]   Patient DOB: Apr 18, 1955   Discharge Date: 03/14/24  Expiration Date: 03/20/24 (must be filled within 7 days of discharge)   Dear Mr Jimmy [name], You have been approved to have the prescriptions written by your discharging physician filled through our [facility] (Medication Assistance Through [facility]) program. This program allows for a one-time (no refills) 34-day supply of selected medications for a low copay amount.  The copay is $3.00 per prescription. For instance, if you have one prescription, you will pay $3.00; for two prescriptions, you pay $6.00; for three prescriptions, you pay $9.00; and so on. Only certain pharmacies are participating in this program with [facility]. You will need to select one of the pharmacies from the attached lists and take your prescriptions, this letter, and your photo ID to one of the participating pharmacies.  We are excited that you are able to use the [facility] program to get your medications. These prescriptions must be filled within 7 days of hospital discharge or they will no longer be valid for the [facility] program. Should you have any problems with your prescriptions please  contact your case management team member at [phone] for [facility] or [phone] for [facility].  Thank you, [facility]    [facility] Program Pharmacies [place]. [facility], [facility], [facility]  [facility] [address] [place], [place] [address] [place], [place] [address], [place], [employer] [address], [place] [address] [place], [place] 884 Transitions of Care - [facility] [address] [place] 1238 [place], [place], [place] [facility] [address], [place] [address], [facility] [address], [place]  [address] [place], [place] [address], [place] 401 [place], [place] [address] [employer]  [address] [place], [place]  [address] [place] [address] [place], [place]  [address] [place], [place]  [address] [place], [place]  [address] [place], [place]  [address] [place], [place] [address], [place]  [address] [place], [place] 1628 [place], [place] [address], [facility] [address], [place] [address] [place], [place] 4700 [place], [place] 1105 [place], [place] [address] [place], [place] [facility] (continued) [address] [place] [address], [place] [address], [place] [address], [place] [address] [place], [place] [address] [place] [address] [place], [place] [address], [place] 6310 [place], [place] [address] [place] [address] [place], [place] [facility] [address], [place] [address], [place] [address] [place], [place] [address] [place], [place] [address] [place], [place] [address], [place] 3501 [place], [place] [address] [place], [place] 2416  [place], [place] [address] [place], [place]  [address] [place], [place] [address] [place]  3703 [place], [place] [address], [place] [address] [facility] [address] [place] [address], [place] [address], [place] [address] [place], [employer]   Expected Discharge Plan: Homeless Shelter Barriers to Discharge: Inadequate or no insurance, Homeless with medical needs, Continued Medical Work up               [facility] and Services In-house Referral: Artist, PCP / Production Designer, Theatre/television/film Services: CM Consult, Medication Assistance   Living arrangements for the past 2 months: No permanent address, Homeless Expected Discharge Date: 03/14/24               DME Arranged: N/A DME Agency: NA       HH Arranged: NA HH Agency: NA         Social Drivers of Health (SDOH) Interventions SDOH Screenings   Food Insecurity: Food Insecurity Present (03/08/2024)  Housing: High Risk (03/08/2024)  Transportation Needs: Unmet Transportation Needs (03/08/2024)  Utilities: At Risk (03/08/2024)  Social Connections: Socially Isolated (03/08/2024)  Tobacco Use: High Risk (03/13/2024)    Readmission Risk Interventions     No data to display

## 2024-03-14 NOTE — Plan of Care (Signed)

## 2024-03-15 ENCOUNTER — Encounter (HOSPITAL_COMMUNITY): Payer: Self-pay | Admitting: Gastroenterology

## 2024-03-15 ENCOUNTER — Other Ambulatory Visit (HOSPITAL_COMMUNITY): Payer: Self-pay

## 2024-03-15 LAB — CULTURE, RESPIRATORY W GRAM STAIN: Culture: NORMAL

## 2024-03-16 ENCOUNTER — Other Ambulatory Visit (HOSPITAL_COMMUNITY): Payer: Self-pay

## 2024-03-16 LAB — SURGICAL PATHOLOGY

## 2024-03-16 NOTE — Discharge Summary (Signed)
 Physician Discharge Summary   Patient: Jimmy Jimmy [name] MRN: [id number] DOB: 01-13-1956  Admit date:     03/08/2024  Discharge date: 03/14/2024  Discharge Physician: Alejandro Marker, DO   PCP: Pcp, No   Recommendations at discharge:   Follow-up and establish with PCP within 1 to 2 weeks repeat CBC, CMP, mag, Phos within 1 week Follow-up with Pulmonology in the outpatient setting within 1 to 2 weeks for repeat chest x-ray in 3 to 6 weeks Repeat thyroid  function test in 4 to 6 weeks  Discharge Diagnoses: Principal Problem:   Acute respiratory failure with hypoxia (HCC) Active Problems:   COPD with acute exacerbation (HCC)   Hypothyroidism   Leukocytosis   Dysphagia  Resolved Problems:   * No resolved hospital problems. [facility] Course: Patient 68 year old gentleman history of homelessness, hypothyroidism, emphysema admitted to the hospital with a productive cough felt likely secondary to acute COPD exacerbation.  Hospitalization has been complicated by nausea vomiting and dysphagia and GI has been consulted as well as Pulmonary.  Pulmonary is adjusting his medications and added doxycycline  given atypical infection.  GI took the patient for an EGD with dilation 03/13/24 and no esophageal abnormalities were found. Anticipating D/C in the next 24 hours.  Assessment and Plan:  Acute Hypoxic Respiratory failure secondary to acute COPD Exacerbation: Patient noted not to be on O2 at baseline not on any scheduled medications. With history of emphysema noted to have previously been hospitalized with an acute COPD exacerbation. -Patient on admission with sats of 86% on room air. Chest x-ray done on admission with hyperinflated and hyperlucent lungs, suggestive of underlying COPD.  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 Influenza A/B, SARS coronavirus 2, and RSV by PCR negative. -Patient placed on scheduled DuoNebs, prednisone . -Continue Pulmicort , Flonase , Mucinex , Claritin ,  PPI. -Patient noted to have been placed on azithromycin  on admission however the evening of 03/08/2024, due to concerns for possible reaction to antibiotics azithromycin  discontinued and patient received a dose of Benadryl  and Pepcid  with clinical improvement. -Continue IV Rocephin  and Doxycycline  added by Pulmonary for concern for atypical infection or viral pneumonitis  -Supportive care. -Patient to be discharged on Breztri  and albuterol  inhalers as well as steroid taper and antibiotics -Repeat CXR 12/10 showed Mildly progressive bronchitic changes superimposed on COPD.  -Pulmonary Consulted for further evaluation and recommendations and recommended obtaining outpatient PFTs for formal diagnosis.  They are also recommending continue DuoNebs 4 times Jimmy [name], hypertonic saline nebs 4 times Jimmy [name], chest PT and prescribing Breztri  inhaler at discharge as well as an albuterol  inhaler as needed.  They have advised the patient to go to pulmonary rehabilitation acknowledging his psychosocial limitations and recommending an Ambulatory home O2 screen prior to discharge. -Pulmonary also obtaining MRSA swab and sputum Cx (pending) -CT Chest done and showed Bilateral bronchial wall thickening, with scattered tree in bud nodular airspace disease within the right upper, right lower, and left lower lobes. The appearance is most consistent with infection and bilateral bronchopneumonia, though sequela of inflammation or aspiration could give this pattern as well. Aortic Atherosclerosis. Stable emphysema. -Will check Ambulatory O2 Screen in the AM and he did not desaturate; Repeat CXR this AM showed that he had Hyperexpansion with chronic interstitial coarsening. No acute cardiopulmonary findings. - Patient will need to follow-up with PCP and pulmonology outpatient setting.  ?? Allergic Reaction: The night before last it is noted that there was a concern for reaction to azithromycin  as patient had reported face reddening  in the  eye and cheek area which did not look similar early on in the shift. Patient given Benadryl  and IV Pepcid  with clinical improvement  Discontinued azithromycin .  Patient noted to have tolerated doxycycline  during last hospitalization and received a dose of IV Rocephin  on presentation. Placed on IV Rocephin  secondary to problem #1. Discontinue Pepcid .   Dysphagia: Per admitting physician patient has described a several month history of choking sensation intermittently with p.o. intake. SLP consulted. Having N/V so will obtain CT Abd/Pelvis and obtain GI evaluation.  -GI recommends to follow CT scan of the abdomen pelvis and if it is negative recommending a barium swallow to further evaluate his nausea/vomiting and dysphagia. -Diet had been changed to SOFT and he has been started on IV PPI with Protonix .   -Given his continued swallowing issues GI is recommending EGD and he underwent this today.  Patient had no esophageal abnormality to explain his dysphagia and his esophagus was dilated.  There is a medium amount of food in the stomach as residue and chronic gastritis that was biopsied with a normal duodenal bulb and the 1st and 2nd portion of the duodenum. -He is improved and tolerated his diet and will need to follow-up with PCP and GI in outpatient setting   Hypothyroidism: Patient during last hospitalization was noted to be hypothyroid and started on Synthroid  during the hospitalization. Patient noted to have been discharged on Synthroid  50 mcg Jimmy [name]. Patient states not on any medications prior to admission. TSH obtained at 28.7. -Continue Levothyroxine  50 mcg Jimmy [name] that was resumed on admission.  Patient will need outpatient follow-up with PCP for repeat TFTs to be done in 4 to 6 weeks.    Leukocytosis: Improved and resolved. Likely reactive leukocytosis as patient with no evidence of an acute infection in the setting of steroids. WBC went from 13.0 -> 10.6 -> 9.0 -> 8.9 -> 7.0 and is now 7.1 at  the time of the. Chest x-ray done negative for any acute infiltrate. Patient afebrile. Patient with no urinary symptoms. Leukocytosis trending down. Having N/V so obtained CT Abd/Pelvis which showed Large right inguinal hernia containing a segment of small bowel. No evidence of incarceration or bowel obstruction. Trace pelvic free fluid, likely reactive.  Patchy right lower lobe airspace disease, consistent with inflammatory or infectious etiology. Aortic  Atherosclerosis  Tobacco Abuse: Has a longstanding tobacco dependence and history of smoking.  The pulmonologist recommending low-dose annual CT scan screening; CT Scan of Chest showed stable Emphysema   Consultants: Pulmonary, Gastroenterology  Procedures performed: EGD; As delineated as above   Disposition: Home  Diet recommendation:  Discharge Diet Orders (From admission, onward)     Start     Ordered   03/14/24 0000  Diet - low sodium heart healthy       Comments: SOFT Diet   03/14/24 1236           Regular diet DISCHARGE MEDICATION: Allergies as of 03/14/2024       Reactions   Azithromycin     reddening in the eye and cheek area, treated with Benadryl  and IV Pepcid          Medication List     TAKE these medications    acetaminophen  325 MG tablet Commonly known as: TYLENOL  Take 2 tablets (650 mg total) by mouth every 6 (six) hours as needed for mild pain (pain score 1-3) or fever (or Fever >/= 101).   albuterol  108 (90 Base) MCG/ACT inhaler Commonly known as: VENTOLIN  HFA Inhale 1-2 puffs  into the lungs every 6 (six) hours as needed for wheezing or shortness of breath.   budesonide -glycopyrrolate -formoterol  160-9-4.8 MCG/ACT Aero inhaler Commonly known as: BREZTRI  Inhale 2 puffs into the lungs 2 (two) times Jimmy [name].   chlorpheniramine-HYDROcodone 10-8 MG/5ML Commonly known as: TUSSIONEX Take 5 mLs by mouth every 12 (twelve) hours as needed for cough.   fluticasone  50 MCG/ACT nasal spray Commonly known as:  FLONASE  Place 2 sprays into both nostrils Jimmy [name].   guaiFENesin  600 MG 12 hr tablet Commonly known as: MUCINEX  Take 1 tablet (600 mg total) by mouth 2 (two) times Jimmy [name] for 5 days.   levothyroxine  50 MCG tablet Commonly known as: SYNTHROID  Take 1 tablet (50 mcg total) by mouth Jimmy [name] at 6 (six) AM.   loratadine  10 MG tablet Commonly known as: CLARITIN  Take 1 tablet (10 mg total) by mouth Jimmy [name].   nicotine  14 mg/24hr patch Commonly known as: NICODERM CQ  - dosed in mg/24 hours Place 1 patch (14 mg total) onto the skin Jimmy [name].   ondansetron  4 MG tablet Commonly known as: ZOFRAN  Take 1 tablet (4 mg total) by mouth every 6 (six) hours as needed for nausea.   pantoprazole  40 MG tablet Commonly known as: Protonix  Take 1 tablet (40 mg total) by mouth Jimmy [name].   phenol 1.4 % Liqd Commonly known as: CHLORASEPTIC Use as directed 1 spray in the mouth or throat as needed for throat irritation / pain.   predniSONE  10 MG tablet Commonly known as: DELTASONE  Take 6 tablets on day 1, 5 tablets on day 2, 4 tablets on day 3, 3 tablets on day 4, 2 tablets on day 5, 1 tablet on day 6 and then stop   sodium chloride  0.65 % Soln nasal spray Commonly known as: OCEAN Place 1 spray into both nostrils as needed for congestion (nose irritation).   traZODone  50 MG tablet Commonly known as: DESYREL  Take 0.5 tablets (25 mg total) by mouth at bedtime as needed for sleep.       ASK your doctor about these medications    doxycycline  100 MG tablet Commonly known as: VIBRA -TABS Take 1 tablet (100 mg total) by mouth every 12 (twelve) hours for 3 days. Ask about: Should I take this medication?        Discharge Exam: Filed Weights   03/08/24 0314 03/08/24 0934  Weight: 53.5 kg 52.2 kg   Vitals:   03/14/24 0523 03/14/24 0929  BP: 132/81   Pulse: 69   Resp: 18   Temp: (!) 97.4 F (36.3 C)   SpO2: 95% 94%   Examination: Physical Exam:  Constitutional: Chronically ill-appearing Caucasian  male in no acute distress Respiratory: Diminished to auscultation bilaterally with some coarse breath sounds and minimal wheezing but no appreciable rales or rhonchi.  No appreciable crackles. Normal respiratory effort and patient is not tachypenic. No accessory muscle use.  Unlabored breathing and was on 2 L but weaned off very easily and did not desaturate on home O2 screen Cardiovascular: RRR, no murmurs / rubs / gallops. S1 and S2 auscultated. No extremity edema Abdomen: Soft, non-tender, non-distended. Bowel sounds positive.  GU: Deferred. Musculoskeletal: No clubbing / cyanosis of digits/nails. No joint deformity upper and lower extremities. Skin: No rashes, lesions, ulcers on limited skin evaluation. No induration; Warm and dry.  Neurologic: CN 2-12 grossly intact with no focal deficits. Romberg sign and cerebellar reflexes not assessed.  Psychiatric: Normal judgment and insight. Alert and oriented x 3. Normal mood and appropriate affect.   Condition at  discharge: stable  The results of significant diagnostics from this hospitalization (including imaging, microbiology, ancillary and laboratory) are listed below for reference.   Imaging Studies: DG CHEST PORT 1 VIEW Result Date: 03/14/2024 CLINICAL DATA:  Shortness of breath. EXAM: PORTABLE CHEST 1 VIEW COMPARISON:  03/13/2024 FINDINGS: Lungs are hyperexpanded. Interstitial markings are diffusely coarsened with chronic features. The cardiopericardial silhouette is within normal limits for size. No acute bony abnormality. IMPRESSION: Hyperexpansion with chronic interstitial coarsening. No acute cardiopulmonary findings. Electronically Signed   By: Camellia Candle M.D.   On: 03/14/2024 07:40   DG CHEST PORT 1 VIEW Result Date: 03/13/2024 EXAM: 1 VIEW(S) XRAY OF THE CHEST 03/13/2024 10:29:00 AM COMPARISON: 03/12/2024 CLINICAL HISTORY: SOB (shortness of breath) FINDINGS: LUNGS AND PLEURA: Hyperinflation with chronic coarsened interstitial  markings. No focal pulmonary opacity. No pleural effusion. No pneumothorax. HEART AND MEDIASTINUM: Atherosclerotic calcifications are noted. No acute abnormality of the cardiac and mediastinal silhouettes. BONES AND SOFT TISSUES: No acute osseous abnormality. IMPRESSION: 1. Hyperinflation with chronic coarsened interstitial markings, stable in appearance from the prior exam. Electronically signed by: Oneil Devonshire MD 03/13/2024 08:37 PM EST RP Workstation: [id number]   DG CHEST PORT 1 VIEW Result Date: 03/12/2024 CLINICAL DATA:  Shortness of breath. EXAM: PORTABLE CHEST 1 VIEW COMPARISON:  03/11/2024 FINDINGS: The lungs are clear without focal pneumonia, edema, pneumothorax or pleural effusion. Extreme right costophrenic angle not included on the film. Interstitial markings are diffusely coarsened with chronic features. The cardiopericardial silhouette is within normal limits for size. No acute bony abnormality. IMPRESSION: Chronic interstitial coarsening without acute cardiopulmonary findings. Electronically Signed   By: Camellia Candle M.D.   On: 03/12/2024 07:27   CT CHEST WO CONTRAST Result Date: 03/11/2024 CLINICAL DATA:  Dyspnea, chronic, unclear etiology EXAM: CT CHEST WITHOUT CONTRAST TECHNIQUE: Multidetector CT imaging of the chest was performed following the standard protocol without IV contrast. RADIATION DOSE REDUCTION: This exam was performed according to the departmental dose-optimization program which includes automated exposure control, adjustment of the mA and/or kV according to patient size and/or use of iterative reconstruction technique. COMPARISON:  03/11/2024, 04/19/2022 FINDINGS: Cardiovascular: Unenhanced imaging of the heart is unremarkable without significant pericardial effusion. Normal caliber of the thoracic aorta. Atherosclerosis of the aorta and coronary vasculature. Mediastinum/Nodes: No enlarged mediastinal or axillary lymph nodes. Thyroid  gland, trachea, and esophagus  demonstrate no significant findings. Lungs/Pleura: Scattered tree in bud nodular airspace disease is identified within the posterior aspect of the right upper lobe and dependent bilateral lower lobes, consistent with sequela of infection, inflammation, or aspiration. Mild upper lobe predominant emphysema. No effusion or pneumothorax. Mild bilateral bronchial wall thickening. Upper Abdomen: No acute abnormality. Musculoskeletal: No acute or destructive bony abnormalities. Reconstructed images demonstrate no additional findings. IMPRESSION: 1. Bilateral bronchial wall thickening, with scattered tree in bud nodular airspace disease within the right upper, right lower, and left lower lobes. The appearance is most consistent with infection and bilateral bronchopneumonia, though sequela of inflammation or aspiration could give this pattern as well. 2.  Aortic Atherosclerosis (ICD10-I70.0). 3. Stable emphysema. Pulmonary emphysema is an independent risk factor for lung cancer. Recommend patient be considered for lung cancer screening. [place]  Chief Financial Officer. Screening for Lung Cancer: [place]  Licensed Conveyancer. JAMA. 2021;325(10):962-970. Electronically Signed   By: Ozell Daring M.D.   On: 03/11/2024 19:37   CT ABDOMEN PELVIS W CONTRAST Result Date: 03/11/2024 CLINICAL DATA:  Nausea and vomiting EXAM: CT ABDOMEN AND PELVIS WITH CONTRAST TECHNIQUE: Multidetector CT imaging  of the abdomen and pelvis was performed using the standard protocol following bolus administration of intravenous contrast. RADIATION DOSE REDUCTION: This exam was performed according to the departmental dose-optimization program which includes automated exposure control, adjustment of the mA and/or kV according to patient size and/or use of iterative reconstruction technique. CONTRAST:  OMNIPAQUE  IOHEXOL  300 MG/ML  SOLN COMPARISON:  03/11/2024 FINDINGS: Lower chest: There is patchy right lower lobe  airspace disease which may reflect inflammatory or infectious etiology. No pleural effusion. Hepatobiliary: No focal liver abnormality is seen. No gallstones, gallbladder wall thickening, or biliary dilatation. Pancreas: Unremarkable. No pancreatic ductal dilatation or surrounding inflammatory changes. Spleen: Normal in size without focal abnormality. Adrenals/Urinary Tract: Low-attenuation bilateral adrenal thickening consistent with hyperplasia. No focal adrenal abnormalities. Kidneys enhance normally. No urinary tract calculi or obstructive uropathy. Bladder is unremarkable. Stomach/Bowel: No bowel obstruction or ileus. Normal appendix right lower quadrant. Moderate stool throughout the colon. No bowel wall thickening or inflammatory change. There is a segment of small bowel contained within a large right inguinal hernia, with no evidence of incarceration or obstruction. Vascular/Lymphatic: Choose 2 Reproductive: Mild prostatomegaly. Other: Trace pelvic free fluid. No free intraperitoneal gas. There is a large right inguinal hernia, with herniated small bowel extending into the right hemiscrotum. No evidence of incarceration or obstruction. Musculoskeletal: No acute or destructive bony abnormalities. Reconstructed images demonstrate no additional findings. IMPRESSION: 1. Large right inguinal hernia containing a segment of small bowel. No evidence of incarceration or bowel obstruction. 2. Trace pelvic free fluid, likely reactive. 3. Patchy right lower lobe airspace disease, consistent with inflammatory or infectious etiology. 4.  Aortic Atherosclerosis (ICD10-I70.0). Electronically Signed   By: Ozell Daring M.D.   On: 03/11/2024 19:34   DG CHEST PORT 1 VIEW Result Date: 03/11/2024 CLINICAL DATA:  Shortness of breath. EXAM: PORTABLE CHEST 1 VIEW COMPARISON:  03/08/2024 FINDINGS: Normal-sized heart. Clear lungs with normal vascularity. Mild peribronchial thickening. The lungs remain mildly hyperexpanded.  Thoracolumbar spine degenerative changes and old, healed right rib fractures. IMPRESSION: Mildly progressive bronchitic changes superimposed on COPD. Electronically Signed   By: Elspeth Bathe M.D.   On: 03/11/2024 13:31   DG Chest 2 View Result Date: 03/08/2024 EXAM: 2 VIEW(S) XRAY OF THE CHEST 03/08/2024 04:02:00 AM COMPARISON: None available. CLINICAL HISTORY: cough FINDINGS: LUNGS AND PLEURA: Hyperinflated and hyperlucent lungs, suggestive of underlying COPD. No focal pulmonary opacity. No pleural effusion. No pneumothorax. HEART AND MEDIASTINUM: No acute abnormality of the cardiac and mediastinal silhouettes. BONES AND SOFT TISSUES: Multilevel degenerative changes of thoracic spine. IMPRESSION: 1. Hyperinflated and hyperlucent lungs, suggestive of underlying COPD. 2. 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 Electronically signed by: Dorethia Molt MD 03/08/2024 04:20 AM EST RP Workstation: [id number]   Microbiology: Results for orders placed or performed during the hospital encounter of 03/08/24  Resp panel by RT-PCR (RSV, Flu A&B, Covid) Anterior Nasal Swab     Status: None   Collection Time: 03/08/24  3:21 AM   Specimen: Anterior Nasal Swab  Result Value Ref Range Status   SARS Coronavirus 2 by RT PCR NEGATIVE NEGATIVE Final    Comment: (NOTE) SARS-CoV-2 target nucleic acids are NOT DETECTED.  The SARS-CoV-2 RNA is generally detectable in upper respiratory specimens during the acute phase of infection. The lowest concentration of SARS-CoV-2 viral copies this assay can detect is 138 copies/mL. A negative result does not preclude SARS-Cov-2 infection and should not be used as the sole basis for treatment or other patient management decisions. A negative  result may occur with  improper specimen collection/handling, submission of specimen other than nasopharyngeal swab, presence of viral mutation(s) within the areas targeted by this assay, and inadequate number of  viral copies(<138 copies/mL). A negative result must be combined with clinical observations, patient history, and epidemiological information. The expected result is Negative.  Fact Sheet for Patients:  bloggercourse.com  Fact Sheet for Healthcare Providers:  seriousbroker.it  This test is no t yet approved or cleared by the [place]  FDA and  has been authorized for detection and/or diagnosis of SARS-CoV-2 by FDA under an Emergency Use Authorization (EUA). This EUA will remain  in effect (meaning this test can be used) for the duration of the COVID-19 declaration under Section 564(b)(1) of the Act, 21 U.S.C.section 360bbb-3(b)(1), unless the authorization is terminated  or revoked sooner.       Influenza A by PCR NEGATIVE NEGATIVE Final   Influenza B by PCR NEGATIVE NEGATIVE Final    Comment: (NOTE) The Xpert Xpress SARS-CoV-2/FLU/RSV plus assay is intended as an aid in the diagnosis of influenza from Nasopharyngeal swab specimens and should not be used as a sole basis for treatment. Nasal washings and aspirates are unacceptable for Xpert Xpress SARS-CoV-2/FLU/RSV testing.  Fact Sheet for Patients: bloggercourse.com  Fact Sheet for Healthcare Providers: seriousbroker.it  This test is not yet approved or cleared by the [place]  FDA and has been authorized for detection and/or diagnosis of SARS-CoV-2 by FDA under an Emergency Use Authorization (EUA). This EUA will remain in effect (meaning this test can be used) for the duration of the COVID-19 declaration under Section 564(b)(1) of the Act, 21 U.S.C. section 360bbb-3(b)(1), unless the authorization is terminated or revoked.     Resp Syncytial Virus by PCR NEGATIVE NEGATIVE Final    Comment: (NOTE) Fact Sheet for Patients: bloggercourse.com  Fact Sheet for Healthcare  Providers: seriousbroker.it  This test is not yet approved or cleared by the [place]  FDA and has been authorized for detection and/or diagnosis of SARS-CoV-2 by FDA under an Emergency Use Authorization (EUA). This EUA will remain in effect (meaning this test can be used) for the duration of the COVID-19 declaration under Section 564(b)(1) of the Act, 21 U.S.C. section 360bbb-3(b)(1), unless the authorization is terminated or revoked.  Performed at [facility], [address], [place], [place] [address]   MRSA Next Gen by PCR, Nasal     Status: None   Collection Time: 03/12/24 11:05 AM   Specimen: Nasal Mucosa; Nasal Swab  Result Value Ref Range Status   MRSA by PCR Next Gen NOT DETECTED NOT DETECTED Final    Comment: (NOTE) The GeneXpert MRSA Assay (FDA approved for NASAL specimens only), is one component of a comprehensive MRSA colonization surveillance program. It is not intended to diagnose MRSA infection nor to guide or monitor treatment for MRSA infections. Test performance is not FDA approved in patients less than 6 years old. Performed at [facility], [address], [place], [place] [address]   Expectorated Sputum Assessment w Gram Stain, Rflx to Resp Cult     Status: None   Collection Time: 03/13/24  6:00 AM   Specimen: SPU  Result Value Ref Range Status   Specimen Description SPUTUM  Final   Special Requests NONE  Final   Sputum evaluation   Final    THIS SPECIMEN IS ACCEPTABLE FOR SPUTUM CULTURE Performed at [facility], [address], [place], [place] [address]    Report  Status 03/13/2024 FINAL  Final  Culture, Respiratory w Gram Stain     Status: None   Collection Time: 03/13/24  6:00 AM   Specimen: SPU  Result Value Ref Range Status   Specimen Description   Final    SPUTUM Performed at [facility], [address], [place], [place] [address]     Special Requests   Final    NONE Reflexed from [id number] Performed at [facility], [address] [place], [place] [address]    Gram Stain   Final    FEW SQUAMOUS EPITHELIAL CELLS PRESENT ABUNDANT GRAM NEGATIVE RODS ABUNDANT GRAM POSITIVE COCCI WBC PRESENT, PREDOMINANTLY PMN    Culture   Final    FEW Normal respiratory flora-no Staph aureus or Pseudomonas seen Performed at [facility], [address], [place], [place] [address]    Report Status 03/15/2024 FINAL  Final   Labs: CBC: Recent Labs  Lab 03/11/24 0432 03/12/24 0439 03/13/24 1012 03/14/24 0433  WBC 9.0 8.9 7.0 7.1  NEUTROABS 7.0 6.8 4.7 4.0  HGB 13.2 13.3 14.4 13.6  HCT 40.2 40.0 44.1 41.4  MCV 96.9 95.5 97.6 96.1  PLT 307 311 290 298   Basic Metabolic Panel: Recent Labs  Lab 03/11/24 0432 03/12/24 0439 03/13/24 1012 03/14/24 0433  NA 135 135 136 135  K 4.1 4.0 3.8 4.1  CL 99 99 99 100  CO2 25 27 28 26   GLUCOSE 93 86 93 88  BUN 21 26* 28* 29*  CREATININE 0.92 0.99 0.91 0.85  CALCIUM 8.8* 9.0 9.1 8.9  MG 2.3 2.4 2.1 2.1  PHOS 3.7 3.1 3.0 3.2   Liver Function Tests: Recent Labs  Lab 03/11/24 0432 03/12/24 0439 03/13/24 1012 03/14/24 0433  AST  --  38 30 28  ALT  --  39 32 29  ALKPHOS  --  88 87 77  BILITOT  --  0.2 0.2 0.3  PROT  --  7.2 7.4 7.0  ALBUMIN 3.7 3.6 3.7 3.5   CBG: No results for input(s): GLUCAP in the last 168 hours.  Discharge time spent: greater than 30 minutes.  Signed: Alejandro Marker, DO [facility] 03/18/2024

## 2024-03-18 ENCOUNTER — Inpatient Hospital Stay: Payer: Self-pay | Admitting: Pulmonary Disease

## 2024-03-18 ENCOUNTER — Encounter: Payer: Self-pay | Admitting: Pulmonary Disease
